# Patient Record
Sex: Male | Born: 1965 | Race: White | Hispanic: No | Marital: Single | Smoking: Never smoker
Health system: Southern US, Community
[De-identification: ages and names within clinical notes are randomized; demographics above are authoritative.]

## PROBLEM LIST (undated history)

## (undated) DIAGNOSIS — J45909 Unspecified asthma, uncomplicated: Secondary | ICD-10-CM

## (undated) DIAGNOSIS — E119 Type 2 diabetes mellitus without complications: Secondary | ICD-10-CM

---

## 2001-07-03 ENCOUNTER — Encounter: Payer: Self-pay | Admitting: Emergency Medicine

## 2001-07-03 ENCOUNTER — Ambulatory Visit (HOSPITAL_BASED_OUTPATIENT_CLINIC_OR_DEPARTMENT_OTHER): Admission: RE | Admit: 2001-07-03 | Discharge: 2001-07-03 | Payer: Self-pay | Admitting: Orthopedic Surgery

## 2003-03-05 ENCOUNTER — Ambulatory Visit: Admission: RE | Admit: 2003-03-05 | Discharge: 2003-03-05 | Payer: Self-pay | Admitting: Family Medicine

## 2003-03-05 ENCOUNTER — Encounter: Payer: Self-pay | Admitting: Family Medicine

## 2003-03-07 ENCOUNTER — Encounter: Payer: Self-pay | Admitting: General Surgery

## 2003-03-07 ENCOUNTER — Encounter: Admission: RE | Admit: 2003-03-07 | Discharge: 2003-03-07 | Payer: Self-pay | Admitting: General Surgery

## 2006-09-15 ENCOUNTER — Encounter: Admission: RE | Admit: 2006-09-15 | Discharge: 2006-09-15 | Payer: Self-pay | Admitting: Orthopaedic Surgery

## 2008-02-27 ENCOUNTER — Emergency Department (HOSPITAL_COMMUNITY): Admission: EM | Admit: 2008-02-27 | Discharge: 2008-02-27 | Payer: Self-pay | Admitting: Emergency Medicine

## 2009-09-01 ENCOUNTER — Observation Stay (HOSPITAL_COMMUNITY): Admission: EM | Admit: 2009-09-01 | Discharge: 2009-09-02 | Payer: Self-pay | Admitting: Emergency Medicine

## 2010-01-31 ENCOUNTER — Emergency Department (HOSPITAL_COMMUNITY): Admission: EM | Admit: 2010-01-31 | Discharge: 2010-01-31 | Payer: Self-pay | Admitting: Family Medicine

## 2010-12-13 LAB — DIFFERENTIAL
Basophils Absolute: 0 10*3/uL (ref 0.0–0.1)
Basophils Relative: 0 % (ref 0–1)
Eosinophils Absolute: 0.1 10*3/uL (ref 0.0–0.7)
Neutro Abs: 4.2 10*3/uL (ref 1.7–7.7)
Neutrophils Relative %: 59 % (ref 43–77)

## 2010-12-13 LAB — CBC
HCT: 40.9 % (ref 39.0–52.0)
Hemoglobin: 13.4 g/dL (ref 13.0–17.0)
MCHC: 33.2 g/dL (ref 30.0–36.0)
MCHC: 33.6 g/dL (ref 30.0–36.0)
MCV: 89.2 fL (ref 78.0–100.0)
MCV: 90.8 fL (ref 78.0–100.0)
Platelets: 215 10*3/uL (ref 150–400)
RBC: 4.46 MIL/uL (ref 4.22–5.81)
RDW: 12.8 % (ref 11.5–15.5)
WBC: 6.7 10*3/uL (ref 4.0–10.5)

## 2010-12-13 LAB — TROPONIN I: Troponin I: 0.01 ng/mL (ref 0.00–0.06)

## 2010-12-13 LAB — RAPID URINE DRUG SCREEN, HOSP PERFORMED
Amphetamines: NOT DETECTED
Barbiturates: NOT DETECTED
Benzodiazepines: NOT DETECTED
Opiates: NOT DETECTED

## 2010-12-13 LAB — CARDIAC PANEL(CRET KIN+CKTOT+MB+TROPI)
CK, MB: 5 ng/mL — ABNORMAL HIGH (ref 0.3–4.0)
Relative Index: 2.1 (ref 0.0–2.5)
Relative Index: 2.2 (ref 0.0–2.5)
Relative Index: 2.4 (ref 0.0–2.5)

## 2010-12-13 LAB — COMPREHENSIVE METABOLIC PANEL
ALT: 31 U/L (ref 0–53)
Alkaline Phosphatase: 42 U/L (ref 39–117)
BUN: 15 mg/dL (ref 6–23)
CO2: 26 mEq/L (ref 19–32)
GFR calc non Af Amer: >60 mL/min (ref >60)
Glucose, Bld: 133 mg/dL — ABNORMAL HIGH (ref 70–99)
Potassium: 3.7 mEq/L (ref 3.5–5.1)
Sodium: 138 mEq/L (ref 135–145)
Total Bilirubin: 0.8 mg/dL (ref 0.3–1.2)

## 2010-12-13 LAB — BASIC METABOLIC PANEL
BUN: 14 mg/dL (ref 6–23)
CO2: 26 mEq/L (ref 19–32)
Chloride: 103 mEq/L (ref 96–112)
Glucose, Bld: 103 mg/dL — ABNORMAL HIGH (ref 70–99)
Potassium: 3.7 mEq/L (ref 3.5–5.1)

## 2010-12-13 LAB — CK TOTAL AND CKMB (NOT AT ARMC): Total CK: 305 U/L — ABNORMAL HIGH (ref 7–232)

## 2010-12-13 LAB — D-DIMER, QUANTITATIVE: D-Dimer, Quant: 0.3 ug/mL-FEU (ref 0.00–0.48)

## 2011-01-28 NOTE — Op Note (Signed)
[facility]  Patient:    Guy Thomas, Guy Thomas Visit Number: [id number] MRN: [id number]          Service Type: DSU Location: [facility] Attending Physician:  Ronne Binning Dictated by:   Nicki Reaper, M.D. Proc. Date: 07/03/01 Admit Date:  07/03/2001                             Operative Report  PREOPERATIVE DIAGNOSIS: Open fracture, left thumb.  POSTOPERATIVE DIAGNOSIS: Open fracture, left thumb.  OPERATION/PROCEDURE: Open reduction and internal fixation of proximal phalanx, intra-articular fracture of left thumb, with repair of extensor tendon.  SURGEON: Nicki Reaper, M.D.  ASSISTANT: Joaquin Courts, R.N.  ANESTHESIA: General.  ANESTHESIOLOGIST: Maren Beach, M.D.  INDICATIONS FOR PROCEDURE: The patient is a 45 year old male, who suffered a crush injury to his left thumb while at work at [employer].  He was referred from the emergency room.  DESCRIPTION OF PROCEDURE: The patient was brought to the operating room, where a general anesthetic was carried out without difficulty.  He was prepped and draped using Betadine scrubbing solution with the left arm free.  The limb was exsanguinated with an Esmarch bandage and a tourniquet placed high on the arm inflated to 250 mm Hg.  The dorsal wound was opened and the laceration to the extensor tendon opened and the interphalangeal joint and fracture of the articular surface identified.  The joint and wound were copiously irrigated with Bacitracin containing saline solution.  The fracture was debrided and reduced, pinned with a 2.8 K-wire transversely.  A second 2.8 K-wire was placed longitudinally through the distal phalanx into the proximal phalanx in an extended position.  The extensor tendon was then repaired with figure-of-eight 4-0 Mersilene sutures.  The volar laceration was explored and found to enter into the subcutaneous tissue only.  The wounds were closed with interrupted 5-0  nylon sutures.  A sterile compressive dressing and splint to the thumb were applied.  The patient tolerated the procedure well and was taken to the recovery room for observation in satisfactory condition.  He is discharged home to return to the [facility] in one week, on Vicodin and Keflex. Dictated by:   Nicki Reaper, M.D. Attending Physician:  Ronne Binning DD:  07/03/01 TD:  07/04/01 Job: [id number]

## 2011-06-09 LAB — CBC
HCT: 41.2
Platelets: 223
WBC: 5.8

## 2011-06-09 LAB — POCT CARDIAC MARKERS
CKMB, poc: 2.6
Operator id: 264031
Troponin i, poc: <0.05

## 2011-06-09 LAB — BASIC METABOLIC PANEL
BUN: 12
Creatinine, Ser: 0.91
GFR calc non Af Amer: >60
Potassium: 4.1

## 2013-05-20 ENCOUNTER — Emergency Department (HOSPITAL_COMMUNITY)
Admission: EM | Admit: 2013-05-20 | Discharge: 2013-05-20 | Disposition: A | Discharge status: Home / Self Care | Payer: Managed Care, Other (non HMO) | Attending: Emergency Medicine | Admitting: Emergency Medicine

## 2013-05-20 ENCOUNTER — Encounter (HOSPITAL_COMMUNITY): Payer: Self-pay | Admitting: Nurse Practitioner

## 2013-05-20 ENCOUNTER — Emergency Department (HOSPITAL_COMMUNITY): Payer: Managed Care, Other (non HMO)

## 2013-05-20 DIAGNOSIS — R350 Frequency of micturition: Secondary | ICD-10-CM | POA: Insufficient documentation

## 2013-05-20 DIAGNOSIS — E785 Hyperlipidemia, unspecified: Secondary | ICD-10-CM | POA: Insufficient documentation

## 2013-05-20 DIAGNOSIS — R1011 Right upper quadrant pain: Secondary | ICD-10-CM | POA: Insufficient documentation

## 2013-05-20 DIAGNOSIS — R109 Unspecified abdominal pain: Secondary | ICD-10-CM

## 2013-05-20 DIAGNOSIS — K5900 Constipation, unspecified: Secondary | ICD-10-CM

## 2013-05-20 DIAGNOSIS — F411 Generalized anxiety disorder: Secondary | ICD-10-CM | POA: Insufficient documentation

## 2013-05-20 DIAGNOSIS — Z880 Allergy status to penicillin: Secondary | ICD-10-CM | POA: Insufficient documentation

## 2013-05-20 DIAGNOSIS — Z79899 Other long term (current) drug therapy: Secondary | ICD-10-CM | POA: Insufficient documentation

## 2013-05-20 DIAGNOSIS — R110 Nausea: Secondary | ICD-10-CM | POA: Insufficient documentation

## 2013-05-20 LAB — COMPREHENSIVE METABOLIC PANEL
AST: 28 U/L (ref 0–37)
CO2: 27 mEq/L (ref 19–32)
Calcium: 9.3 mg/dL (ref 8.4–10.5)
Chloride: 100 mEq/L (ref 96–112)
Creatinine, Ser: 0.78 mg/dL (ref 0.50–1.35)
GFR calc Af Amer: >90 mL/min (ref >90)
GFR calc non Af Amer: >90 mL/min (ref >90)
Glucose, Bld: 104 mg/dL — ABNORMAL HIGH (ref 70–99)
Total Bilirubin: 0.7 mg/dL (ref 0.3–1.2)

## 2013-05-20 LAB — URINALYSIS, ROUTINE W REFLEX MICROSCOPIC
Bilirubin Urine: NEGATIVE
Hgb urine dipstick: NEGATIVE
Nitrite: NEGATIVE
Protein, ur: NEGATIVE mg/dL
Specific Gravity, Urine: 1.016 (ref 1.005–1.030)
Urobilinogen, UA: 0.2 mg/dL (ref 0.0–1.0)

## 2013-05-20 LAB — LIPASE, BLOOD: Lipase: 29 U/L (ref 11–59)

## 2013-05-20 LAB — CBC WITH DIFFERENTIAL/PLATELET
MCHC: 35.6 g/dL (ref 30.0–36.0)
Neutro Abs: 3.3 10*3/uL (ref 1.7–7.7)
Neutrophils Relative %: 50 % (ref 43–77)
RDW: 12.7 % (ref 11.5–15.5)

## 2013-05-20 MED ORDER — MAGNESIUM CITRATE PO SOLN: 1.0000 | Freq: Once | ORAL | Status: DC

## 2013-05-20 MED ORDER — ONDANSETRON HCL 4 MG/2ML IJ SOLN: 4.0000 mg | Freq: Once | INTRAMUSCULAR | Status: DC

## 2013-05-20 MED ORDER — SODIUM CHLORIDE 0.9 % IV BOLUS (SEPSIS): 1000.0000 mL | Freq: Once | INTRAVENOUS | Status: DC

## 2013-05-20 MED ORDER — MAGNESIUM CITRATE PO SOLN
1.0000 | Freq: Once | ORAL | Status: AC
  Administered 2013-05-20: 1 via ORAL
  Filled 2013-05-20: qty 296

## 2013-05-20 MED ORDER — KETOROLAC TROMETHAMINE 30 MG/ML IJ SOLN: 30.0000 mg | Freq: Once | INTRAMUSCULAR | Status: DC

## 2013-05-20 NOTE — ED Provider Notes (Signed)
TIME SEEN: 7:03 PM  CHIEF COMPLAINT: Right-sided abdominal pain, urinary frequency, nausea  HPI: Patient is a 47 yo M all with a history of hyperlipidemia and anxiety who presents to the emergency department with acute onset of right upper quadrant pain that started at 4 AM and woke him from sleep. He states that his pain has migrated down into his right lower quadrant and now into his right testicle. He has had frequent urination but no dysuria or hematuria. He has had nausea but no vomiting. No diarrhea. He had subjective fevers last night. He called his doctor instructed him to come to the emergency department to evaluate for possible kidney stone. He has never had prior history of kidney stones. No prior history of any abdominal surgery. No bad food exposure. No sick contacts. No recent travel.  ROS: See HPI Constitutional: Subjective fever  Eyes: no drainage  ENT: no runny nose   Cardiovascular:  no chest pain  Resp: no SOB  GI: no vomiting GU: no dysuria Integumentary: no rash  Allergy: no hives  Musculoskeletal: no leg swelling  Neurological: no slurred speech ROS otherwise negative  PAST MEDICAL HISTORY/PAST SURGICAL HISTORY:  History reviewed. No pertinent past medical history.  MEDICATIONS:  Prior to Admission medications   Medication Sig Start Date End Date Taking? Authorizing Provider  ALPRAZolam [name]) 0.5 MG tablet Take 0.5 mg by mouth at bedtime as needed for sleep (sleep).   Yes Historical Provider, MD  rosuvastatin (CRESTOR) 10 MG tablet Take 10 mg by mouth daily.   Yes Historical Provider, MD  testosterone (ANDROGEL) 50 MG/5GM GEL Place 5 g onto the skin daily.   Yes Historical Provider, MD    ALLERGIES:  Allergies  Allergen Reactions  . Penicillins     SOCIAL HISTORY:  History  Substance Use Topics  . Smoking status: Never Smoker   . Smokeless tobacco: Not on file  . Alcohol Use: No    FAMILY HISTORY: History reviewed. No pertinent family  history.  EXAM: BP 154/95  Pulse 66  Temp(Src) 98 F (36.7 C) (Oral)  Resp 16  Ht 5\' 11"  (1.803 m)  Wt 182 lb 4.8 oz (82.691 kg)  BMI 25.44 kg/m2  SpO2 98% CONSTITUTIONAL: Alert and oriented and responds appropriately to questions. Well-appearing; well-nourished, no apparent distress HEAD: Normocephalic EYES: Conjunctivae clear, PERRL ENT: normal nose; no rhinorrhea; moist mucous membranes; pharynx without lesions noted NECK: Supple, no meningismus, no LAD  CARD: RRR; S1 and S2 appreciated; no murmurs, no clicks, no rubs, no gallops RESP: Normal chest excursion without splinting or tachypnea; breath sounds clear and equal bilaterally; no wheezes, no rhonchi, no rales,  ABD/GI: Normal bowel sounds; non-distended; soft, non-tender, no rebound, no guarding GU:  No external genital lesions, no penile discharge, no scrotal masses, testicles are nontender to palpation, cremasteric reflex normal bilaterally, 2+ femoral pulses bilaterally BACK:  The back appears normal and is non-tender to palpation, there is no CVA tenderness EXT: Normal ROM in all joints; non-tender to palpation; no edema; normal capillary refill; no cyanosis    SKIN: Normal color for age and race; warm NEURO: Moves all extremities equally PSYCH: The patient's mood and manner are appropriate. Grooming and personal hygiene are appropriate.  MEDICAL DECISION MAKING: Patient with migrating right-sided abdominal pain consistent with possible kidney stone. He is hemodynamically stable. Labs unremarkable. Urinalysis pending. Will also obtain a CT of his abdomen and pelvis. We'll give pain in nausea medication. Anticipate discharge home with outpatient followup.  ED  PROGRESS: Urine shows no sign of infection or blood. CT scan pending. Signed out to night team.    Layla Maw Flint Hakeem, DO 05/20/13 2031

## 2013-05-20 NOTE — ED Notes (Signed)
Pt c/o RUQ, R testicle pain, frequent painful urination, and nausea throughout the day today.

## 2014-08-13 ENCOUNTER — Ambulatory Visit (INDEPENDENT_AMBULATORY_CARE_PROVIDER_SITE_OTHER): Payer: Managed Care, Other (non HMO) | Admitting: Podiatry

## 2014-08-13 ENCOUNTER — Other Ambulatory Visit: Payer: Self-pay | Admitting: Podiatry

## 2014-08-13 ENCOUNTER — Encounter: Payer: Self-pay | Admitting: Podiatry

## 2014-08-13 ENCOUNTER — Ambulatory Visit (INDEPENDENT_AMBULATORY_CARE_PROVIDER_SITE_OTHER): Payer: Managed Care, Other (non HMO)

## 2014-08-13 VITALS — BP 112/71 | HR 61 | Resp 16

## 2014-08-13 DIAGNOSIS — M7662 Achilles tendinitis, left leg: Secondary | ICD-10-CM

## 2014-08-13 DIAGNOSIS — M779 Enthesopathy, unspecified: Secondary | ICD-10-CM

## 2014-08-13 MED ORDER — TRIAMCINOLONE ACETONIDE 10 MG/ML IJ SUSP
10.0000 mg | Freq: Once | INTRAMUSCULAR | Status: AC
  Administered 2014-08-13: 10 mg

## 2014-08-13 MED ORDER — DICLOFENAC SODIUM 75 MG PO TBEC: 75.0000 mg | DELAYED_RELEASE_TABLET | Freq: Two times a day (BID) | ORAL | Status: DC

## 2014-08-13 NOTE — Progress Notes (Signed)
   Subjective:    Patient ID: Guy Thomas, male    DOB: 04/26/1966, 48 y.o.   MRN: [id number]  HPI Comments: "I hurt my foot but didn't realize it"  Patient c/o aching lateral and dorsal foot for about 2-3 weeks. He does jujitsu and noticed when he took shoes off lateral foot was swollen and bruised. That is now better but more pain in achilles area. He has been trying to wear boots which helps.  Foot Pain      Review of Systems  Respiratory:       Calf pain with walking  All other systems reviewed and are negative.      Objective:   Physical Exam        Assessment & Plan:

## 2014-08-13 NOTE — Progress Notes (Signed)
Subjective:     Patient ID: Guy Thomas, male   DOB: 08/28/1966, 48 y.o.   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      Objective:   Physical Exam  Constitutional: He is oriented to person, place, and time.  Cardiovascular: Intact distal pulses.   Musculoskeletal: Normal range of motion.  Neurological: He is oriented to person, place, and time.  Skin: Skin is warm.  Nursing note and vitals reviewed.  neurovascular status intact with muscle strength adequate range of motion subtalar and midtarsal joint within normal limits. Patient has discomfort lateral side of the foot that is near the peroneal insertion but not directly on bone and also mild discomfort in the Achilles which is more at the musculotendinous junction with the tendon found to be working well     Assessment:     Lateral foot tendinitis with probable compensatory mild Achilles tendinitis due to gait change    Plan:     H&P and x-rays reviewed. Injected the lateral foot 3 mg Kenalog 5 mg Xylocaine and instructed on physical therapy and stretching for the Achilles. Reappoint if symptoms indicate

## 2015-03-19 ENCOUNTER — Observation Stay (HOSPITAL_BASED_OUTPATIENT_CLINIC_OR_DEPARTMENT_OTHER): Payer: No Typology Code available for payment source

## 2015-03-19 ENCOUNTER — Encounter (HOSPITAL_COMMUNITY): Payer: Self-pay

## 2015-03-19 ENCOUNTER — Other Ambulatory Visit (HOSPITAL_COMMUNITY): Payer: Managed Care, Other (non HMO)

## 2015-03-19 ENCOUNTER — Observation Stay (HOSPITAL_COMMUNITY)
Admission: EM | Admit: 2015-03-19 | Discharge: 2015-03-20 | Disposition: A | Discharge status: Home / Self Care | Payer: No Typology Code available for payment source | Attending: Internal Medicine | Admitting: Internal Medicine

## 2015-03-19 ENCOUNTER — Inpatient Hospital Stay (HOSPITAL_COMMUNITY): Payer: No Typology Code available for payment source

## 2015-03-19 ENCOUNTER — Observation Stay (HOSPITAL_COMMUNITY): Payer: Managed Care, Other (non HMO)

## 2015-03-19 ENCOUNTER — Observation Stay (HOSPITAL_COMMUNITY): Payer: No Typology Code available for payment source

## 2015-03-19 ENCOUNTER — Emergency Department (HOSPITAL_COMMUNITY): Payer: No Typology Code available for payment source

## 2015-03-19 DIAGNOSIS — Z880 Allergy status to penicillin: Secondary | ICD-10-CM | POA: Insufficient documentation

## 2015-03-19 DIAGNOSIS — M25511 Pain in right shoulder: Secondary | ICD-10-CM | POA: Diagnosis not present

## 2015-03-19 DIAGNOSIS — R7989 Other specified abnormal findings of blood chemistry: Secondary | ICD-10-CM | POA: Diagnosis present

## 2015-03-19 DIAGNOSIS — M47812 Spondylosis without myelopathy or radiculopathy, cervical region: Secondary | ICD-10-CM | POA: Diagnosis present

## 2015-03-19 DIAGNOSIS — R2990 Unspecified symptoms and signs involving the nervous system: Secondary | ICD-10-CM

## 2015-03-19 DIAGNOSIS — I639 Cerebral infarction, unspecified: Secondary | ICD-10-CM

## 2015-03-19 DIAGNOSIS — R569 Unspecified convulsions: Secondary | ICD-10-CM | POA: Diagnosis present

## 2015-03-19 DIAGNOSIS — M47892 Other spondylosis, cervical region: Secondary | ICD-10-CM | POA: Insufficient documentation

## 2015-03-19 DIAGNOSIS — F419 Anxiety disorder, unspecified: Secondary | ICD-10-CM | POA: Diagnosis not present

## 2015-03-19 DIAGNOSIS — G459 Transient cerebral ischemic attack, unspecified: Secondary | ICD-10-CM

## 2015-03-19 DIAGNOSIS — E785 Hyperlipidemia, unspecified: Secondary | ICD-10-CM | POA: Diagnosis present

## 2015-03-19 DIAGNOSIS — J45909 Unspecified asthma, uncomplicated: Secondary | ICD-10-CM | POA: Insufficient documentation

## 2015-03-19 DIAGNOSIS — R200 Anesthesia of skin: Secondary | ICD-10-CM

## 2015-03-19 DIAGNOSIS — I4510 Unspecified right bundle-branch block: Secondary | ICD-10-CM | POA: Insufficient documentation

## 2015-03-19 DIAGNOSIS — Z7982 Long term (current) use of aspirin: Secondary | ICD-10-CM | POA: Insufficient documentation

## 2015-03-19 DIAGNOSIS — R202 Paresthesia of skin: Secondary | ICD-10-CM

## 2015-03-19 HISTORY — DX: Unspecified asthma, uncomplicated: J45.909

## 2015-03-19 LAB — DIFFERENTIAL
Basophils Absolute: 0 10*3/uL (ref 0.0–0.1)
Basophils Relative: 0 % (ref 0–1)
Eosinophils Absolute: 0.1 10*3/uL (ref 0.0–0.7)
Eosinophils Relative: 2 % (ref 0–5)
Lymphocytes Relative: 42 % (ref 12–46)
Lymphs Abs: 2.6 10*3/uL (ref 0.7–4.0)
Monocytes Absolute: 0.6 10*3/uL (ref 0.1–1.0)
Monocytes Relative: 10 % (ref 3–12)
Neutro Abs: 2.8 10*3/uL (ref 1.7–7.7)
Neutrophils Relative %: 46 % (ref 43–77)

## 2015-03-19 LAB — RAPID URINE DRUG SCREEN, HOSP PERFORMED
Amphetamines: NOT DETECTED
Barbiturates: NOT DETECTED
Benzodiazepines: NOT DETECTED
Cocaine: NOT DETECTED
Opiates: NOT DETECTED
Tetrahydrocannabinol: NOT DETECTED

## 2015-03-19 LAB — URINALYSIS, ROUTINE W REFLEX MICROSCOPIC
Bilirubin Urine: NEGATIVE
Glucose, UA: NEGATIVE mg/dL
Hgb urine dipstick: NEGATIVE
Ketones, ur: NEGATIVE mg/dL
Leukocytes, UA: NEGATIVE
Nitrite: NEGATIVE
Protein, ur: NEGATIVE mg/dL
Specific Gravity, Urine: 1.016 (ref 1.005–1.030)
Urobilinogen, UA: 0.2 mg/dL (ref 0.0–1.0)
pH: 5.5 (ref 5.0–8.0)

## 2015-03-19 LAB — I-STAT CHEM 8, ED
BUN: 19 mg/dL (ref 6–20)
Calcium, Ion: 1.14 mmol/L (ref 1.12–1.23)
Chloride: 100 mmol/L — ABNORMAL LOW (ref 101–111)
Creatinine, Ser: 0.8 mg/dL (ref 0.61–1.24)
Glucose, Bld: 97 mg/dL (ref 65–99)
HCT: 48 % (ref 39.0–52.0)
Hemoglobin: 16.3 g/dL (ref 13.0–17.0)
Potassium: 3.7 mmol/L (ref 3.5–5.1)
Sodium: 138 mmol/L (ref 135–145)
TCO2: 24 mmol/L (ref 0–100)

## 2015-03-19 LAB — CBG MONITORING, ED: Glucose-Capillary: 90 mg/dL (ref 65–99)

## 2015-03-19 LAB — COMPREHENSIVE METABOLIC PANEL
ALT: 31 U/L (ref 17–63)
AST: 43 U/L — ABNORMAL HIGH (ref 15–41)
Albumin: 3.9 g/dL (ref 3.5–5.0)
Alkaline Phosphatase: 45 U/L (ref 38–126)
Anion gap: 9 (ref 5–15)
BUN: 16 mg/dL (ref 6–20)
CO2: 26 mmol/L (ref 22–32)
Calcium: 8.7 mg/dL — ABNORMAL LOW (ref 8.9–10.3)
Chloride: 101 mmol/L (ref 101–111)
Creatinine, Ser: 0.87 mg/dL (ref 0.61–1.24)
GFR calc Af Amer: >60 mL/min (ref >60)
GFR calc non Af Amer: >60 mL/min (ref >60)
Glucose, Bld: 96 mg/dL (ref 65–99)
Potassium: 3.8 mmol/L (ref 3.5–5.1)
Sodium: 136 mmol/L (ref 135–145)
Total Bilirubin: 1 mg/dL (ref 0.3–1.2)
Total Protein: 6.6 g/dL (ref 6.5–8.1)

## 2015-03-19 LAB — CBC
HCT: 45.3 % (ref 39.0–52.0)
Hemoglobin: 15.5 g/dL (ref 13.0–17.0)
MCH: 31.1 pg (ref 26.0–34.0)
MCHC: 34.2 g/dL (ref 30.0–36.0)
MCV: 90.8 fL (ref 78.0–100.0)
Platelets: 182 10*3/uL (ref 150–400)
RBC: 4.99 MIL/uL (ref 4.22–5.81)
RDW: 12.8 % (ref 11.5–15.5)
WBC: 6.2 10*3/uL (ref 4.0–10.5)

## 2015-03-19 LAB — APTT: aPTT: 25 seconds (ref 24–37)

## 2015-03-19 LAB — ETHANOL: Alcohol, Ethyl (B): <5 mg/dL (ref <5)

## 2015-03-19 LAB — PROTIME-INR
INR: 1.01 (ref 0.00–1.49)
Prothrombin Time: 13.5 seconds (ref 11.6–15.2)

## 2015-03-19 LAB — I-STAT TROPONIN, ED: Troponin i, poc: 0.01 ng/mL (ref 0.00–0.08)

## 2015-03-19 MED ORDER — DICLOFENAC SODIUM 75 MG PO TBEC
75.0000 mg | DELAYED_RELEASE_TABLET | Freq: Two times a day (BID) | ORAL | Status: DC
  Administered 2015-03-19 – 2015-03-20 (×2): 75 mg via ORAL
  Filled 2015-03-19 (×3): qty 1

## 2015-03-19 MED ORDER — ALPRAZOLAM 0.5 MG PO TABS: 0.5000 mg | ORAL_TABLET | Freq: Every evening | ORAL | Status: DC | PRN

## 2015-03-19 MED ORDER — ASPIRIN 300 MG RE SUPP: 300.0000 mg | Freq: Every day | RECTAL | Status: DC

## 2015-03-19 MED ORDER — ENOXAPARIN SODIUM 40 MG/0.4ML ~~LOC~~ SOLN
40.0000 mg | SUBCUTANEOUS | Status: DC
  Administered 2015-03-19 – 2015-03-20 (×2): 40 mg via SUBCUTANEOUS
  Filled 2015-03-19 (×2): qty 0.4

## 2015-03-19 MED ORDER — SENNOSIDES-DOCUSATE SODIUM 8.6-50 MG PO TABS: 1.0000 | ORAL_TABLET | Freq: Every evening | ORAL | Status: DC | PRN

## 2015-03-19 MED ORDER — STROKE: EARLY STAGES OF RECOVERY BOOK
Freq: Once | Status: AC
  Administered 2015-03-19: 13:00:00

## 2015-03-19 MED ORDER — LORAZEPAM 2 MG/ML IJ SOLN
0.5000 mg | INTRAMUSCULAR | Status: DC | PRN
Start: 2015-03-19 — End: 2015-03-20
  Administered 2015-03-19 – 2015-03-20 (×2): 0.5 mg via INTRAVENOUS
  Filled 2015-03-19 (×3): qty 1

## 2015-03-19 MED ORDER — ASPIRIN 325 MG PO TABS
325.0000 mg | ORAL_TABLET | Freq: Every day | ORAL | Status: DC
  Administered 2015-03-19 – 2015-03-20 (×2): 325 mg via ORAL
  Filled 2015-03-19 (×2): qty 1

## 2015-03-19 MED ORDER — ALPRAZOLAM 0.25 MG PO TABS
0.2500 mg | ORAL_TABLET | Freq: Once | ORAL | Status: DC
  Filled 2015-03-19: qty 1

## 2015-03-19 MED ORDER — IOHEXOL 350 MG/ML SOLN
80.0000 mL | Freq: Once | INTRAVENOUS | Status: AC | PRN
  Administered 2015-03-19: 80 mL via INTRAVENOUS

## 2015-03-19 MED ORDER — TESTOSTERONE 50 MG/5GM (1%) TD GEL: 5.0000 g | Freq: Every day | TRANSDERMAL | Status: DC

## 2015-03-19 MED ORDER — ROSUVASTATIN CALCIUM 10 MG PO TABS
10.0000 mg | ORAL_TABLET | Freq: Every day | ORAL | Status: DC
  Administered 2015-03-19: 10 mg via ORAL
  Filled 2015-03-19 (×2): qty 1

## 2015-03-19 NOTE — ED Notes (Signed)
Dr Juleen ChinaKohut at bedside.  Code stroke called.
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
## 2015-03-19 NOTE — Progress Notes (Signed)
EEG completed, results pending. 

## 2015-03-19 NOTE — ED Notes (Signed)
Pt unable to complete MRI pt returned to room at this time.

## 2015-03-19 NOTE — Progress Notes (Signed)
  Echocardiogram 2D Echocardiogram has been performed.  Delcie RochENNINGTON, Lorelle Macaluso 03/19/2015, 5:37 PM
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
## 2015-03-19 NOTE — ED Notes (Signed)
Pt states that he has had to be medicated with prior MRI. Pt states that he is willing to try MRI this morning without medication. Requested prn orders for patient.

## 2015-03-19 NOTE — ED Notes (Signed)
MD at bedside. 

## 2015-03-19 NOTE — Progress Notes (Signed)
Patient arrived to room from ED. Safety precautions and orders reviewed with patient. MD paged per pt's arrival. TELE applied and confirmed. Will continue to monitor.   Sim BoastHavy, RN

## 2015-03-19 NOTE — ED Notes (Addendum)
PT states he was driving to work and he began experiencing dizziness, sob and L sided numbness.  He felt he was going to pass out.  Denies weakness.  Pt sates took 325 mg asa this am per norm.

## 2015-03-19 NOTE — ED Provider Notes (Signed)
CSN: [id number]     Arrival date & time 03/19/15  1308 History   First MD Initiated Contact with Patient 03/19/15 0700     Chief Complaint  Patient presents with  . Numbness  . Near Syncope     (Consider location/radiation/quality/duration/timing/severity/associated sxs/prior Treatment) HPI   48yM with L sided numbness. Onset around 0640 this morning while driving to work. Sudden onset dizziness, diaphoresis and numbness of L arm and leg. Describes "dizzy" as sensation that he was about to pass out.  Currently feels better, but numbness has persisted. Took aspirin shortly after symptoms began. Denies any pain anywhere. No focal weakness. No visual complaints. Hx of high cholesterol and takes crestor. Denies any cardiac history. Runs 3x per week and lifts weights regularly and has been asymptomatic when doing this recently.    No past medical history on file. No past surgical history on file. No family history on file. History  Substance Use Topics  . Smoking status: Never Smoker   . Smokeless tobacco: Not on file  . Alcohol Use: No    Review of Systems  All systems reviewed and negative, other than as noted in HPI.   Allergies  Penicillins  Home Medications   Prior to Admission medications   Medication Sig Start Date End Date Taking? Authorizing Provider  ALPRAZolam [name]) 0.5 MG tablet Take 0.5 mg by mouth at bedtime as needed for sleep (sleep).    Historical Provider, MD  diclofenac (VOLTAREN) 75 MG EC tablet Take 1 tablet (75 mg total) by mouth 2 (two) times daily. 08/13/14   [name], DPM  rosuvastatin (CRESTOR) 10 MG tablet Take 10 mg by mouth daily.    Historical Provider, MD  testosterone (ANDROGEL) 50 MG/5GM GEL Place 5 g onto the skin daily.    Historical Provider, MD   BP 142/86 mmHg  Pulse 68  Temp(Src) 97.5 F (36.4 C) (Oral)  Resp 15  SpO2 99% Physical Exam  Constitutional: He is oriented to person, place, and time. He appears well-developed and  well-nourished. No distress.  HENT:  Head: Normocephalic and atraumatic.  Eyes: Conjunctivae are normal. Right eye exhibits no discharge. Left eye exhibits no discharge.  Neck: Neck supple.  Cardiovascular: Normal rate, regular rhythm and normal heart sounds.  Exam reveals no gallop and no friction rub.   No murmur heard. Pulmonary/Chest: Effort normal and breath sounds normal. No respiratory distress.  Abdominal: Soft. He exhibits no distension. There is no tenderness.  Musculoskeletal: He exhibits no edema or tenderness.  Neurological: He is alert and oriented to person, place, and time.  Speech clear. Content appropriate. CN 2-12 intact. Strength 5/5 R side. Pronator drift LUE. Strength 5/5 LLE. Decreased sensation to light touch on L side.   Skin: Skin is warm and dry.  Psychiatric: He has a normal mood and affect. His behavior is normal. Thought content normal.  Nursing note and vitals reviewed.   ED Course  Procedures (including critical care time)  CRITICAL CARE Performed by: Raeford Razor Total critical care time: 35 minutes Re: stroke symptoms, consideration for TPA Critical care time was exclusive of separately billable procedures and treating other patients. Critical care was necessary to treat or prevent imminent or life-threatening deterioration. Critical care was time spent personally by me on the following activities: development of treatment plan with patient and/or surrogate as well as nursing, discussions with consultants, evaluation of patient's response to treatment, examination of patient, obtaining history from patient or surrogate, ordering and performing  treatments and interventions, ordering and review of laboratory studies, ordering and review of radiographic studies, pulse oximetry and re-evaluation of patient's condition.   Labs Review Labs Reviewed  COMPREHENSIVE METABOLIC PANEL - Abnormal; Notable for the following:    Calcium 8.7 (*)    AST 43 (*)     All other components within normal limits  I-STAT CHEM 8, ED - Abnormal; Notable for the following:    Chloride 100 (*)    All other components within normal limits  ETHANOL  PROTIME-INR  APTT  CBC  DIFFERENTIAL  URINE RAPID DRUG SCREEN, HOSP PERFORMED  URINALYSIS, ROUTINE W REFLEX MICROSCOPIC (NOT AT [facility])  I-STAT TROPOININ, ED  CBG MONITORING, ED    Imaging Review Ct Angio Head W/cm &/or Wo Cm  03/19/2015   CLINICAL DATA:  Left-sided weakness and numbness.  EXAM: CT ANGIOGRAPHY HEAD AND NECK  TECHNIQUE: Multidetector CT imaging of the head and neck was performed using the standard protocol during bolus administration of intravenous contrast. Multiplanar CT image reconstructions and MIPs were obtained to evaluate the vascular anatomy. Carotid stenosis measurements (when applicable) are obtained utilizing NASCET criteria, using the distal internal carotid diameter as the denominator.  CONTRAST:  80mL OMNIPAQUE IOHEXOL 350 MG/ML SOLN  COMPARISON:  CT head without contrast 03/19/2015.  FINDINGS: CT HEAD  Brain: The source images demonstrate no acute cortical infarct, hemorrhage, or mass lesion. Ventricles are of normal size. No significant extra-axial fluid collection is present. The basal ganglia and insular cortex is intact.  ASPECTS score = 10/10  [place] Stroke Program Early CT Score  Normal score = 10  Calvarium and skull base: Within normal limits  Paranasal sinuses: Clear  Orbits: Within normal limits  CTA NECK  Aortic arch: A 3 vessel arch configuration is present. There is no significant atherosclerotic calcification or stenosis at the arch.  Right carotid system: To the right common carotid artery is within normal limits. The bifurcation is unremarkable. The cervical right ICA is normal. No vascular injury is evident.  Left carotid system: The left common carotid artery is within normal limits. Focal calcification is noted along the posterior aspect of the bifurcation without a significant  stenosis. Cervical ICA is otherwise normal.  Vertebral arteries:The vertebral arteries both originate from the subclavian arteries. There is minimal calcification at the origin of the right vertebral artery without a significant stenosis relative to the more distal vessel. The left vertebral artery is the dominant vessel. No significant focal stenoses or vascular injury is evident within the vertebral arteries of the neck.  Skeleton: Bone windows demonstrate endplate degenerative change in uncovertebral disease most prominently at C3-4 and C4-5. Facet disease is more prominent left than right at C2-3 and C3-4 with fusion across the facet joints at C2-3 bilaterally. There is reversal of the cervical lordosis superiorly.  Other neck: Soft tissues of the neck are otherwise unremarkable. The thyroid is within normal limits. The lung apices are clear. No significant adenopathy is present. Salivary glands are within normal limits.  CTA HEAD  Anterior circulation: Minimal atherosclerotic calcifications are evident within the left cavernous internal carotid artery. There is no significant stenosis or vascular injury. The terminal ICA is normal bilaterally. The A1 and M1 segments are normal. The anterior communicating artery is patent. The MCA bifurcations are intact. ACA and MCA branch vessels are within normal limits.  Posterior circulation: The left vertebral artery is the dominant vessel. PICA origins are visualized and normal. The basilar artery is within normal limits. Both  posterior cerebral arteries originate from the basilar tip. The PCA branch vessels are within normal limits.  Venous sinuses: The dural sinuses are patent. The straight sinus and deep cerebral veins are within normal limits. The cortical veins are intact.  Anatomic variants: None  Delayed phase: No pathologic enhancement is evident.  IMPRESSION: 1. Minimal atherosclerotic changes at the left cavernous internal carotid artery and left carotid  bifurcation without significant stenosis. 2. No large vessel occlusion. 3. Normal variant circle of Willis without significant proximal stenosis, aneurysm, or branch vessel occlusion.   Electronically Signed   By: Marin Robertshristopher  Mattern M.D.   On: 03/19/2015 08:27   Ct Head Wo Contrast  03/19/2015   CLINICAL DATA:  Code stroke.  Left-sided weakness and numbness  EXAM: CT HEAD WITHOUT CONTRAST  TECHNIQUE: Contiguous axial images were obtained from the base of the skull through the vertex without intravenous contrast.  COMPARISON:  None.  FINDINGS: Ventricle size normal. Mild chronic microvascular ischemic change in the white matter.  Negative for acute infarct.  Negative for hemorrhage or mass.  Calvarium intact.  IMPRESSION: Mild chronic microvascular ischemic change in the white matter. No acute abnormality.  Critical Value/emergent results were called by telephone at the time of interpretation on 03/19/2015 at 7:34 am to Dr. Raeford RazorSTEPHEN Orestes Geiman , who verbally acknowledged these results.   Electronically Signed   By: Marlan Palauharles  Clark M.D.   On: 03/19/2015 07:34   Ct Angio Neck W/cm &/or Wo/cm  03/19/2015   CLINICAL DATA:  Left-sided weakness and numbness.  EXAM: CT ANGIOGRAPHY HEAD AND NECK  TECHNIQUE: Multidetector CT imaging of the head and neck was performed using the standard protocol during bolus administration of intravenous contrast. Multiplanar CT image reconstructions and MIPs were obtained to evaluate the vascular anatomy. Carotid stenosis measurements (when applicable) are obtained utilizing NASCET criteria, using the distal internal carotid diameter as the denominator.  CONTRAST:  80mL OMNIPAQUE IOHEXOL 350 MG/ML SOLN  COMPARISON:  CT head without contrast 03/19/2015.  FINDINGS: CT HEAD  Brain: The source images demonstrate no acute cortical infarct, hemorrhage, or mass lesion. Ventricles are of normal size. No significant extra-axial fluid collection is present. The basal ganglia and insular cortex is intact.   ASPECTS score = 10/10  [place] Stroke Program Early CT Score  Normal score = 10  Calvarium and skull base: Within normal limits  Paranasal sinuses: Clear  Orbits: Within normal limits  CTA NECK  Aortic arch: A 3 vessel arch configuration is present. There is no significant atherosclerotic calcification or stenosis at the arch.  Right carotid system: To the right common carotid artery is within normal limits. The bifurcation is unremarkable. The cervical right ICA is normal. No vascular injury is evident.  Left carotid system: The left common carotid artery is within normal limits. Focal calcification is noted along the posterior aspect of the bifurcation without a significant stenosis. Cervical ICA is otherwise normal.  Vertebral arteries:The vertebral arteries both originate from the subclavian arteries. There is minimal calcification at the origin of the right vertebral artery without a significant stenosis relative to the more distal vessel. The left vertebral artery is the dominant vessel. No significant focal stenoses or vascular injury is evident within the vertebral arteries of the neck.  Skeleton: Bone windows demonstrate endplate degenerative change in uncovertebral disease most prominently at C3-4 and C4-5. Facet disease is more prominent left than right at C2-3 and C3-4 with fusion across the facet joints at C2-3 bilaterally. There is reversal of the  cervical lordosis superiorly.  Other neck: Soft tissues of the neck are otherwise unremarkable. The thyroid is within normal limits. The lung apices are clear. No significant adenopathy is present. Salivary glands are within normal limits.  CTA HEAD  Anterior circulation: Minimal atherosclerotic calcifications are evident within the left cavernous internal carotid artery. There is no significant stenosis or vascular injury. The terminal ICA is normal bilaterally. The A1 and M1 segments are normal. The anterior communicating artery is patent. The MCA  bifurcations are intact. ACA and MCA branch vessels are within normal limits.  Posterior circulation: The left vertebral artery is the dominant vessel. PICA origins are visualized and normal. The basilar artery is within normal limits. Both posterior cerebral arteries originate from the basilar tip. The PCA branch vessels are within normal limits.  Venous sinuses: The dural sinuses are patent. The straight sinus and deep cerebral veins are within normal limits. The cortical veins are intact.  Anatomic variants: None  Delayed phase: No pathologic enhancement is evident.  IMPRESSION: 1. Minimal atherosclerotic changes at the left cavernous internal carotid artery and left carotid bifurcation without significant stenosis. 2. No large vessel occlusion. 3. Normal variant circle of Willis without significant proximal stenosis, aneurysm, or branch vessel occlusion.   Electronically Signed   By: Marin Roberts M.D.   On: 03/19/2015 08:27     EKG Interpretation   Date/Time:  Thursday March 19 2015 07:05:58 EDT Ventricular Rate:  59 PR Interval:  155 QRS Duration: 113 QT Interval:  423 QTC Calculation: 419 R Axis:   79 Text Interpretation:  Sinus rhythm Incomplete right bundle branch block No  old tracing to compare Confirmed by Jerod Mcquain  MD, Lovel Suazo ([id number]) on 03/19/2015  7:11:54 AM      MDM   Final diagnoses:  Acute CVA (cerebrovascular accident)    48yM with L sided numbness. I also think he has a pronator drift on L. Consider ACS, but doubt. Atypical symptoms. EKG non concerning. Concerning for CVA and made "Code Stroke."     Raeford Razor, MD 03/19/15 [phone]
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
## 2015-03-19 NOTE — ED Notes (Signed)
Stroke team at bedside

## 2015-03-19 NOTE — ED Notes (Signed)
Patient transported to CT for CTA 

## 2015-03-19 NOTE — ED Notes (Signed)
Patient transported to MRI 

## 2015-03-19 NOTE — Procedures (Signed)
EEG report.  Brief clinical history: 49 y.o. male with a history of hyperlipidemia presenting with left hemiparesis and hemisensory changes as well as speech difficulty. Deficits have markedly improved. He still has mild sensory changes and minimal coordination abnormality involving left lower extremity.    Technique: this is a 17 channel routine scalp EEG performed at the bedside with bipolar and monopolar montages arranged in accordance to the international 10/20 system of electrode placement. One channel was dedicated to EKG recording.  The study was performed during wakefulness, drowsiness, and stage 2 sleep. Hyperventilation and intermittent photic stimulation were utilized as activating procedures.  Description:In the wakeful state, the best background consisted of a medium amplitude, posterior dominant, well sustained, symmetric and reactive 10 Hz rhythm. Drowsiness demonstrated dropout of the alpha rhythm. Stage 2 sleep showed symmetric and synchronous sleep spindles without intermixed epileptiform discharges. Hyperventilation induced mild, diffuse, physiologic slowing but no epileptiform discharges. Intermittent photic stimulation did induce a normal driving response.  No focal or generalized epileptiform discharges noted.  No pathologic areas of slowing seen.  EKG showed sinus rhythm.  Impression: this is a normal awake and asleep EEG. Please, be aware that a normal EEG does not exclude the possibility of epilepsy.  Clinical correlation is advised.   Wyatt Portelasvaldo Khai Arrona, MD [facility]

## 2015-03-19 NOTE — ED Notes (Signed)
MD at bedside.- Neurol

## 2015-03-20 ENCOUNTER — Observation Stay (HOSPITAL_COMMUNITY): Payer: No Typology Code available for payment source

## 2015-03-20 DIAGNOSIS — E291 Testicular hypofunction: Secondary | ICD-10-CM

## 2015-03-20 DIAGNOSIS — M25511 Pain in right shoulder: Secondary | ICD-10-CM

## 2015-03-20 DIAGNOSIS — F411 Generalized anxiety disorder: Secondary | ICD-10-CM | POA: Diagnosis not present

## 2015-03-20 DIAGNOSIS — R569 Unspecified convulsions: Secondary | ICD-10-CM

## 2015-03-20 DIAGNOSIS — M47812 Spondylosis without myelopathy or radiculopathy, cervical region: Secondary | ICD-10-CM | POA: Diagnosis not present

## 2015-03-20 DIAGNOSIS — E785 Hyperlipidemia, unspecified: Secondary | ICD-10-CM

## 2015-03-20 DIAGNOSIS — G459 Transient cerebral ischemic attack, unspecified: Principal | ICD-10-CM

## 2015-03-20 LAB — LIPID PANEL
CHOL/HDL RATIO: 3.1 ratio
CHOLESTEROL: 151 mg/dL (ref 0–200)
HDL: 48 mg/dL (ref >40)
LDL Cholesterol: 85 mg/dL (ref 0–99)
Triglycerides: 90 mg/dL (ref <150)
VLDL: 18 mg/dL (ref 0–40)

## 2015-03-20 NOTE — Progress Notes (Signed)
Discharge order received. Pt alert and oriented. Discharge instructions provided with verbalize understanding. D/C to home with son by bedside. IV lines removed. Ambulatory to vehicle.  Shella SpearingKahin, Charity fundraiserN.
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
## 2015-03-20 NOTE — Progress Notes (Signed)
SLP Cancellation Note  Patient Details Name: Guy Thomas MRN: [id number] DOB: 11/26/1965   Cancelled treatment:       Reason Eval/Treat Not Completed: SLP screened, no needs identified, will sign off. Pt and his son believe that he is at his baseline and MRI is negative for acute infarct.    Maxcine HamLaura Paiewonsky, M.A. CCC-SLP [phone]  Maxcine Hamaiewonsky, Samentha Perham 03/20/2015, 2:15 PM
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
## 2015-03-20 NOTE — Progress Notes (Signed)
SLP Cancellation Note  Patient Details Name: Guy Thomas MRN: [id number] DOB: 11/30/1965   Cancelled treatment:       Reason Eval/Treat Not Completed: Patient at procedure or test/unavailable   Guy Thomas, Guy Thomas [phone]  Guy Hamaiewonsky, Nahiara Kretzschmar 03/20/2015, 12:05 PM
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
## 2015-03-20 NOTE — Progress Notes (Signed)
PT Cancellation Note  Patient Details Name: Guy Thomas MRN: [id number] DOB: 10/05/1965   Cancelled Treatment:    Reason Eval/Treat Not Completed: PT screened, no needs identified, will sign off (Per pt and OT he is at baseline, pt reports no weakness, numbness or difficulty with coordination. PT signing off. )   Tamala SerUhlenberg, Ziv Welchel Kistler 03/20/2015, 9:24 AM [phone]

## 2015-03-21 LAB — HEMOGLOBIN A1C
Hgb A1c MFr Bld: 5.8 % — ABNORMAL HIGH (ref 4.8–5.6)
Mean Plasma Glucose: 120 mg/dL

## 2016-06-16 IMAGING — CT CT ANGIO NECK
1 of 10 series · 1 of 33 positions shown · IV contrast (Iohexol (Omnipaque 350))
Comparison: CT head without contrast 03/19/2015.

CLINICAL DATA: Left-sided weakness and numbness.



[Series 200: locator · axial · 0.49mm/px · 1 of 1 slices shown]
[im 1/1  soft-tissue]
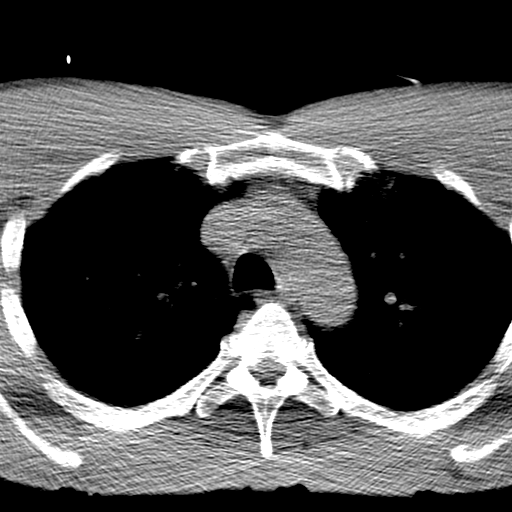

[1 of 33 positions shown; findings below may reference images not displayed]

FINDINGS: CT HEAD

Brain: The source images demonstrate no acute cortical infarct,
hemorrhage, or mass lesion. Ventricles are of normal size. No
significant extra-axial fluid collection is present. The basal
ganglia and insular cortex is intact.

ASPECTS score = [DATE]

[place] Stroke Program Early CT Score

Normal score = 10

Calvarium and skull base: Within normal limits

Paranasal sinuses: Clear

Orbits: Within normal limits

CTA NECK

Aortic arch: A 3 vessel arch configuration is present. There is no
significant atherosclerotic calcification or stenosis at the arch.

Right carotid system: To the right common carotid artery is within
normal limits. The bifurcation is unremarkable. The cervical right
ICA is normal. No vascular injury is evident.

Left carotid system: The left common carotid artery is within normal
limits. Focal calcification is noted along the posterior aspect of
the bifurcation without a significant stenosis. Cervical ICA is
otherwise normal.

Vertebral arteries:The vertebral arteries both originate from the
subclavian arteries. There is minimal calcification at the origin of
the right vertebral artery without a significant stenosis relative
to the more distal vessel. The left vertebral artery is the dominant
vessel. No significant focal stenoses or vascular injury is evident
within the vertebral arteries of the neck.

Skeleton: Bone windows demonstrate endplate degenerative change in
uncovertebral disease most prominently at C3-4 and C4-5. Facet
disease is more prominent left than right at C2-3 and C3-4 with
fusion across the facet joints at C2-3 bilaterally. There is
reversal of the cervical lordosis superiorly.

Other neck: Soft tissues of the neck are otherwise unremarkable. The
thyroid is within normal limits. The lung apices are clear. No
significant adenopathy is present. Salivary glands are within normal
limits.

CTA HEAD

Anterior circulation: Minimal atherosclerotic calcifications are
evident within the left cavernous internal carotid artery. There is
no significant stenosis or vascular injury. The terminal ICA is
normal bilaterally. The A1 and M1 segments are normal. The anterior
communicating artery is patent. The MCA bifurcations are intact. ACA
and MCA branch vessels are within normal limits.

Posterior circulation: The left vertebral artery is the dominant
vessel. PICA origins are visualized and normal. The basilar artery
is within normal limits. Both posterior cerebral arteries originate
from the basilar tip. The PCA branch vessels are within normal
limits.

Venous sinuses: The dural sinuses are patent. The straight sinus and
deep cerebral veins are within normal limits. The cortical veins are
intact.

Anatomic variants: None

Delayed phase: No pathologic enhancement is evident.
IMPRESSION: 1. Minimal atherosclerotic changes at the left cavernous internal
carotid artery and left carotid bifurcation without significant
stenosis.
2. No large vessel occlusion.
3. Normal variant circle of Willis without significant proximal
stenosis, aneurysm, or branch vessel occlusion.

## 2016-06-16 IMAGING — CT CT HEAD W/O CM
2 series · 16 of 30 positions shown, 18 images · non-contrast
Comparison: None.

CLINICAL DATA: Code stroke.  Left-sided weakness and numbness

EXAM:
CT HEAD WITHOUT CONTRAST
TECHNIQUE: Contiguous axial images were obtained from the base of the skull
through the vertex without intravenous contrast.

[Series 201: head w/o, idose (1) · axial · non-contrast · 0.49mm/px · z∈[+71,+191]mm · 8 of 32 slices shown, 10 images]
[im 4/32  brain]
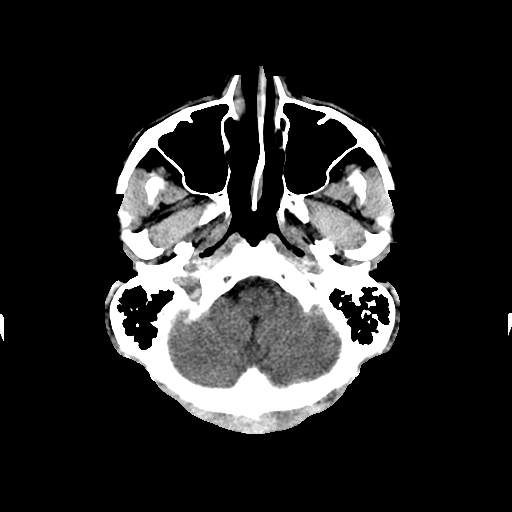
[im 4/32  bone]
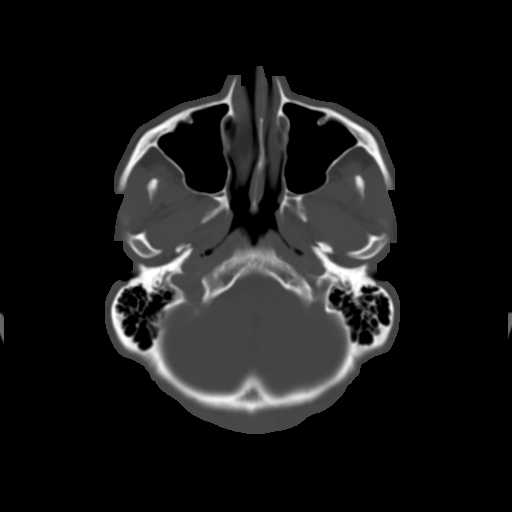
[im 7/32  brain]
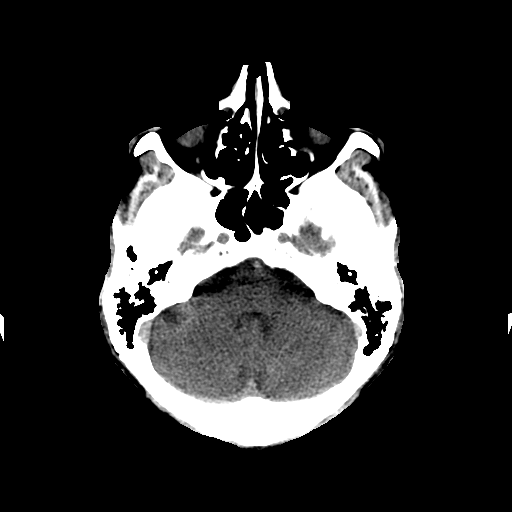
[im 11/32  brain]
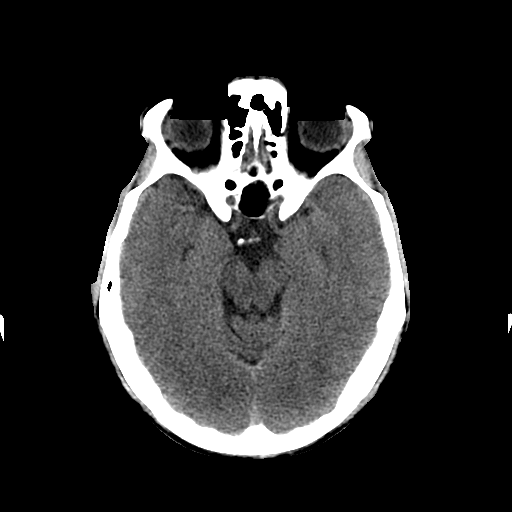
[im 14/32  brain]
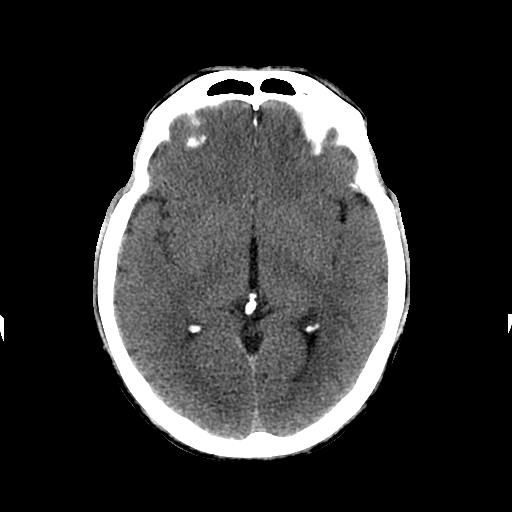
[im 18/32  brain]
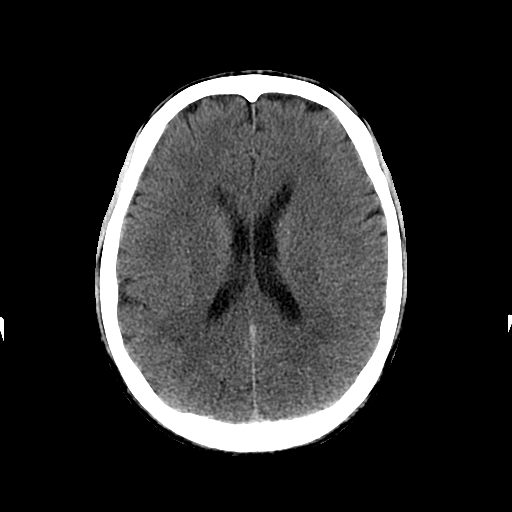
[im 18/32  bone]
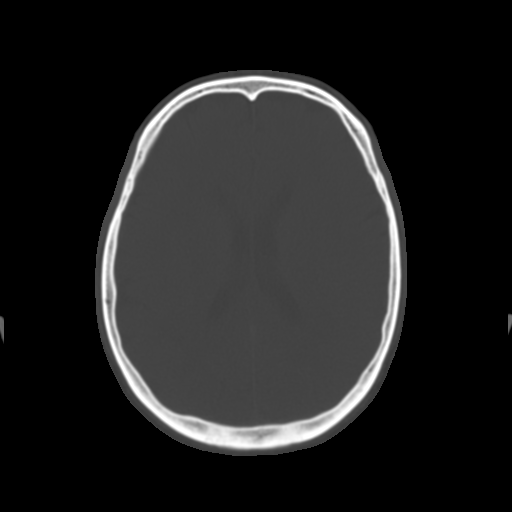
[im 21/32  brain]
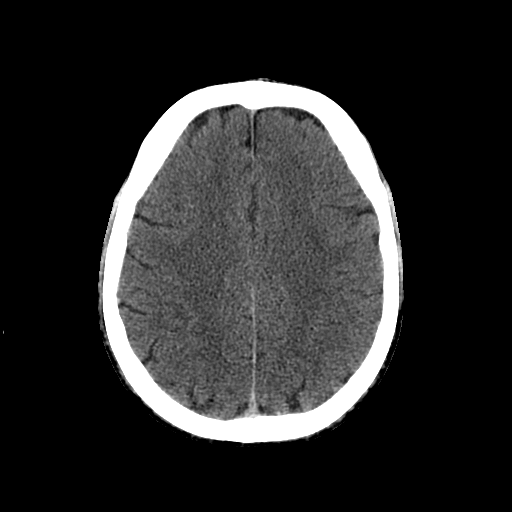
[im 25/32  brain]
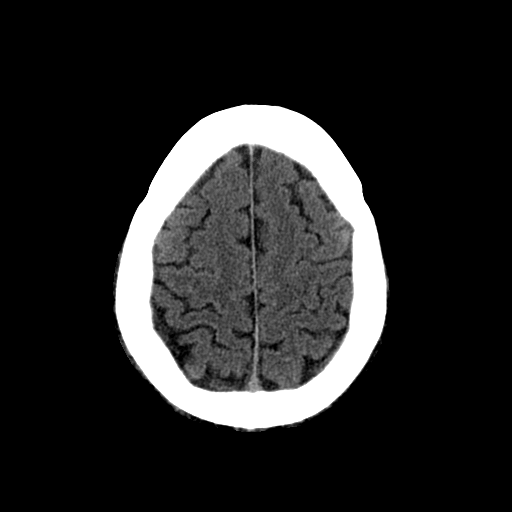
[im 28/32  brain]
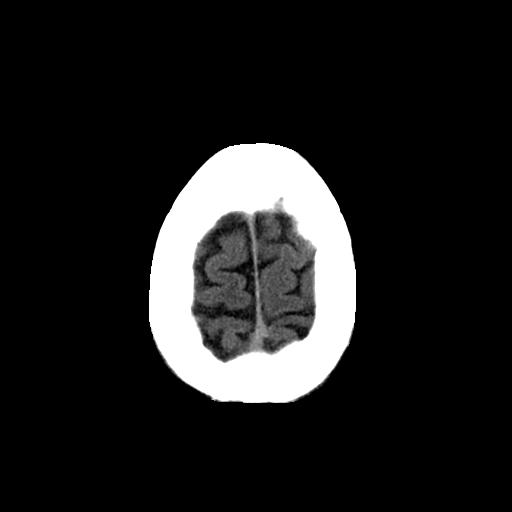

[Series 202: head w/o bone, idose (1) · axial · non-contrast · 0.49mm/px · z∈[+70,+195]mm · 8 of 64 slices shown]
[im 7/64  bone]
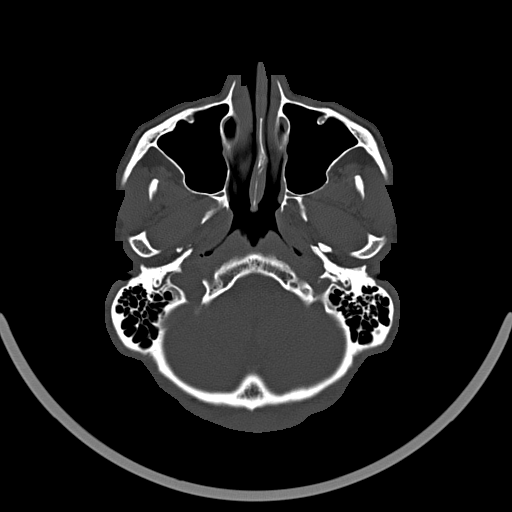
[im 14/64  bone]
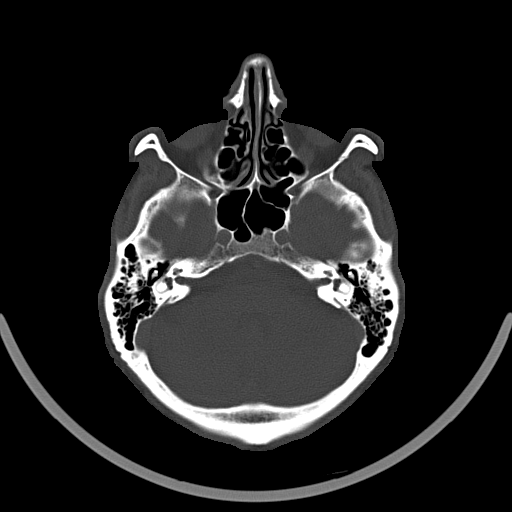
[im 20/64  bone]
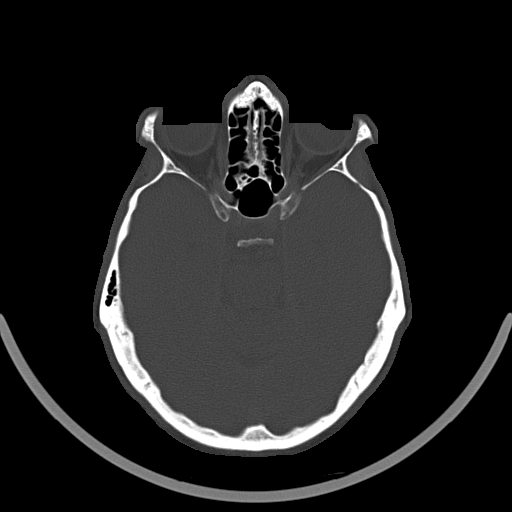
[im 27/64  bone]
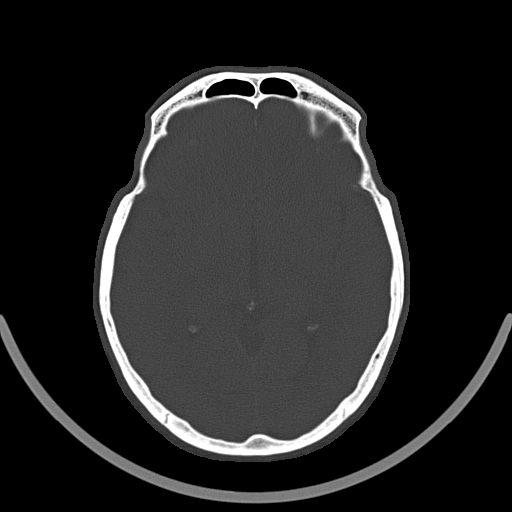
[im 37/64  bone]
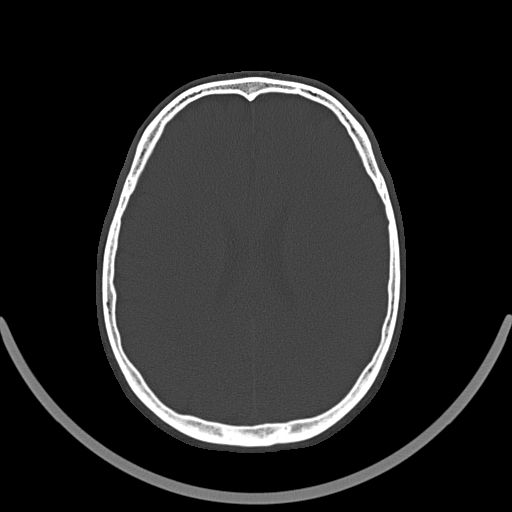
[im 44/64  bone]
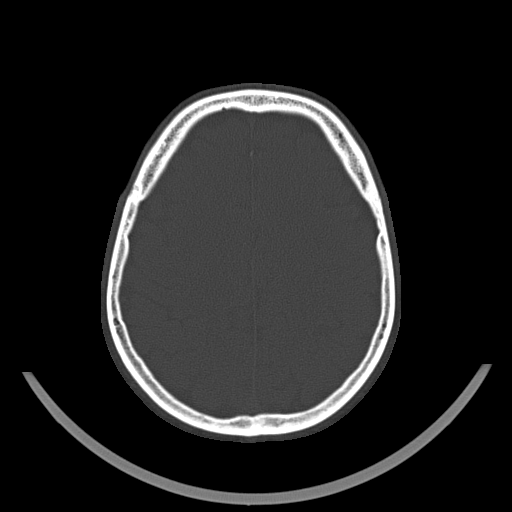
[im 50/64  bone]
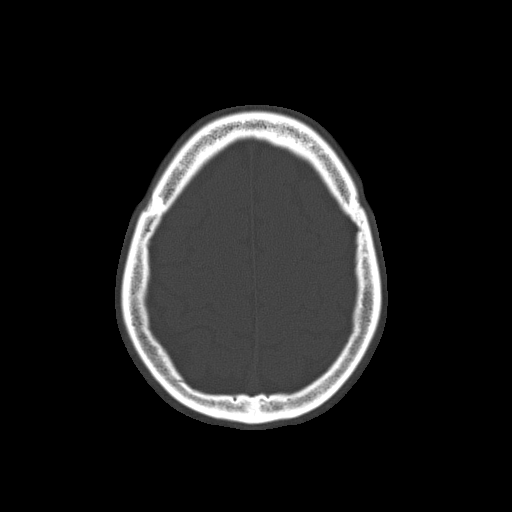
[im 57/64  bone]
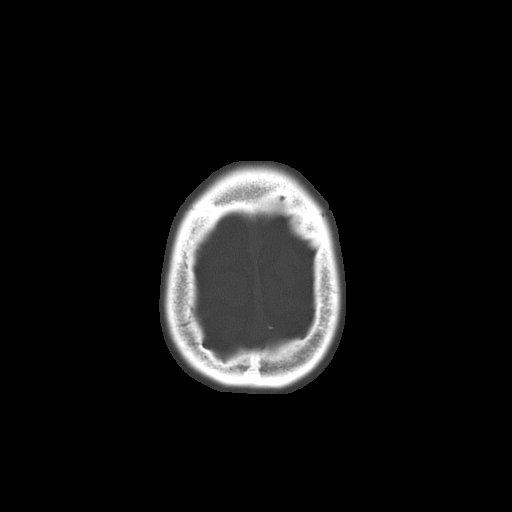

[16 of 30 positions shown; findings below may reference images not displayed]

FINDINGS: Ventricle size normal. Mild chronic microvascular ischemic change in
the white matter.

Negative for acute infarct.  Negative for hemorrhage or mass.

Calvarium intact.
IMPRESSION: Mild chronic microvascular ischemic change in the white matter. No
acute abnormality.

Critical Value/emergent results were called by telephone at the time
of interpretation on 03/19/2015 at [DATE] to Dr. CHMT WM , who
verbally acknowledged these results.

## 2019-12-30 ENCOUNTER — Other Ambulatory Visit: Payer: Self-pay | Admitting: Neurological Surgery

## 2019-12-30 DIAGNOSIS — M545 Low back pain, unspecified: Secondary | ICD-10-CM

## 2020-01-22 ENCOUNTER — Ambulatory Visit
Admission: RE | Admit: 2020-01-22 | Discharge: 2020-01-22 | Disposition: A | Discharge status: Home / Self Care | Payer: Managed Care, Other (non HMO) | Source: Ambulatory Visit | Attending: Neurological Surgery | Admitting: Neurological Surgery

## 2020-01-22 DIAGNOSIS — M545 Low back pain, unspecified: Secondary | ICD-10-CM

## 2022-08-08 ENCOUNTER — Emergency Department (HOSPITAL_COMMUNITY): Payer: Managed Care, Other (non HMO)

## 2022-08-08 ENCOUNTER — Inpatient Hospital Stay (HOSPITAL_COMMUNITY): Payer: Managed Care, Other (non HMO)

## 2022-08-08 ENCOUNTER — Other Ambulatory Visit: Payer: Self-pay

## 2022-08-08 ENCOUNTER — Inpatient Hospital Stay (HOSPITAL_COMMUNITY)
Admission: EM | Admit: 2022-08-08 | Discharge: 2022-08-10 | DRG: 063 | Disposition: A | Discharge status: Home / Self Care | Payer: Managed Care, Other (non HMO) | Attending: Neurology | Admitting: Neurology

## 2022-08-08 DIAGNOSIS — E785 Hyperlipidemia, unspecified: Secondary | ICD-10-CM | POA: Diagnosis present

## 2022-08-08 DIAGNOSIS — J45909 Unspecified asthma, uncomplicated: Secondary | ICD-10-CM | POA: Diagnosis present

## 2022-08-08 DIAGNOSIS — Z79899 Other long term (current) drug therapy: Secondary | ICD-10-CM

## 2022-08-08 DIAGNOSIS — I6789 Other cerebrovascular disease: Secondary | ICD-10-CM | POA: Diagnosis present

## 2022-08-08 DIAGNOSIS — I6381 Other cerebral infarction due to occlusion or stenosis of small artery: Secondary | ICD-10-CM | POA: Diagnosis present

## 2022-08-08 DIAGNOSIS — I6389 Other cerebral infarction: Secondary | ICD-10-CM

## 2022-08-08 DIAGNOSIS — I6782 Cerebral ischemia: Secondary | ICD-10-CM | POA: Diagnosis not present

## 2022-08-08 DIAGNOSIS — H532 Diplopia: Secondary | ICD-10-CM | POA: Diagnosis present

## 2022-08-08 DIAGNOSIS — F419 Anxiety disorder, unspecified: Secondary | ICD-10-CM | POA: Diagnosis present

## 2022-08-08 DIAGNOSIS — Z880 Allergy status to penicillin: Secondary | ICD-10-CM | POA: Diagnosis not present

## 2022-08-08 DIAGNOSIS — G459 Transient cerebral ischemic attack, unspecified: Secondary | ICD-10-CM

## 2022-08-08 DIAGNOSIS — R112 Nausea with vomiting, unspecified: Secondary | ICD-10-CM | POA: Diagnosis present

## 2022-08-08 DIAGNOSIS — I639 Cerebral infarction, unspecified: Secondary | ICD-10-CM

## 2022-08-08 DIAGNOSIS — Z7982 Long term (current) use of aspirin: Secondary | ICD-10-CM | POA: Diagnosis not present

## 2022-08-08 DIAGNOSIS — Z8673 Personal history of transient ischemic attack (TIA), and cerebral infarction without residual deficits: Secondary | ICD-10-CM

## 2022-08-08 DIAGNOSIS — R42 Dizziness and giddiness: Secondary | ICD-10-CM | POA: Diagnosis present

## 2022-08-08 DIAGNOSIS — R29701 NIHSS score 1: Secondary | ICD-10-CM | POA: Diagnosis present

## 2022-08-08 DIAGNOSIS — I10 Essential (primary) hypertension: Secondary | ICD-10-CM | POA: Diagnosis present

## 2022-08-08 HISTORY — DX: Type 2 diabetes mellitus without complications: E11.9

## 2022-08-08 LAB — ECHOCARDIOGRAM COMPLETE
AR max vel: 3.45 cm2
AV Area VTI: 3.48 cm2
AV Area mean vel: 3.21 cm2
AV Mean grad: 4 mmHg
AV Peak grad: 7.3 mmHg
Ao pk vel: 1.35 m/s
Area-P 1/2: 3.77 cm2
Height: 71 in
MV VTI: 4.4 cm2
S' Lateral: 3.55 cm
Weight: 3139.35 oz

## 2022-08-08 LAB — PROTIME-INR
INR: 1 (ref 0.8–1.2)
Prothrombin Time: 12.6 seconds (ref 11.4–15.2)

## 2022-08-08 LAB — DIFFERENTIAL
Abs Immature Granulocytes: 0.01 10*3/uL (ref 0.00–0.07)
Basophils Absolute: 0 10*3/uL (ref 0.0–0.1)
Basophils Relative: 1 %
Eosinophils Absolute: 0.1 10*3/uL (ref 0.0–0.5)
Eosinophils Relative: 1 %
Immature Granulocytes: 0 %
Lymphocytes Relative: 37 %
Lymphs Abs: 2.7 10*3/uL (ref 0.7–4.0)
Monocytes Absolute: 0.7 10*3/uL (ref 0.1–1.0)
Monocytes Relative: 9 %
Neutro Abs: 3.9 10*3/uL (ref 1.7–7.7)
Neutrophils Relative %: 52 %

## 2022-08-08 LAB — I-STAT CHEM 8, ED
BUN: 23 mg/dL — ABNORMAL HIGH (ref 6–20)
Calcium, Ion: 1.11 mmol/L — ABNORMAL LOW (ref 1.15–1.40)
Chloride: 104 mmol/L (ref 98–111)
Creatinine, Ser: 0.8 mg/dL (ref 0.61–1.24)
Glucose, Bld: 142 mg/dL — ABNORMAL HIGH (ref 70–99)
HCT: 45 % (ref 39.0–52.0)
Hemoglobin: 15.3 g/dL (ref 13.0–17.0)
Potassium: 3.8 mmol/L (ref 3.5–5.1)
Sodium: 139 mmol/L (ref 135–145)
TCO2: 22 mmol/L (ref 22–32)

## 2022-08-08 LAB — COMPREHENSIVE METABOLIC PANEL
ALT: 29 U/L (ref 0–44)
AST: 32 U/L (ref 15–41)
Albumin: 4.1 g/dL (ref 3.5–5.0)
Alkaline Phosphatase: 39 U/L (ref 38–126)
Anion gap: 7 (ref 5–15)
BUN: 20 mg/dL (ref 6–20)
CO2: 25 mmol/L (ref 22–32)
Calcium: 8.9 mg/dL (ref 8.9–10.3)
Chloride: 106 mmol/L (ref 98–111)
Creatinine, Ser: 0.89 mg/dL (ref 0.61–1.24)
GFR, Estimated: >60 mL/min (ref >60)
Glucose, Bld: 141 mg/dL — ABNORMAL HIGH (ref 70–99)
Potassium: 3.8 mmol/L (ref 3.5–5.1)
Sodium: 138 mmol/L (ref 135–145)
Total Bilirubin: 0.9 mg/dL (ref 0.3–1.2)
Total Protein: 6.9 g/dL (ref 6.5–8.1)

## 2022-08-08 LAB — CBC
HCT: 44.7 % (ref 39.0–52.0)
Hemoglobin: 15.4 g/dL (ref 13.0–17.0)
MCH: 32 pg (ref 26.0–34.0)
MCHC: 34.5 g/dL (ref 30.0–36.0)
MCV: 92.9 fL (ref 80.0–100.0)
Platelets: 232 10*3/uL (ref 150–400)
RBC: 4.81 MIL/uL (ref 4.22–5.81)
RDW: 12.5 % (ref 11.5–15.5)
WBC: 7.4 10*3/uL (ref 4.0–10.5)
nRBC: 0 % (ref 0.0–0.2)

## 2022-08-08 LAB — ETHANOL: Alcohol, Ethyl (B): <10 mg/dL (ref <10)

## 2022-08-08 LAB — CBG MONITORING, ED: Glucose-Capillary: 131 mg/dL — ABNORMAL HIGH (ref 70–99)

## 2022-08-08 LAB — GLUCOSE, CAPILLARY: Glucose-Capillary: 100 mg/dL — ABNORMAL HIGH (ref 70–99)

## 2022-08-08 LAB — APTT: aPTT: 21 seconds — ABNORMAL LOW (ref 24–36)

## 2022-08-08 LAB — MRSA NEXT GEN BY PCR, NASAL: MRSA by PCR Next Gen: NOT DETECTED

## 2022-08-08 MED ORDER — PANTOPRAZOLE SODIUM 40 MG IV SOLR
40.0000 mg | Freq: Every day | INTRAVENOUS | Status: DC
  Administered 2022-08-08: 40 mg via INTRAVENOUS
  Filled 2022-08-08: qty 10

## 2022-08-08 MED ORDER — ONDANSETRON HCL 4 MG/2ML IJ SOLN
4.0000 mg | Freq: Once | INTRAMUSCULAR | Status: AC
  Administered 2022-08-08: 4 mg via INTRAVENOUS

## 2022-08-08 MED ORDER — INSULIN ASPART 100 UNIT/ML IJ SOLN: 0.0000 [IU] | Freq: Three times a day (TID) | INTRAMUSCULAR | Status: DC

## 2022-08-08 MED ORDER — TENECTEPLASE FOR STROKE
0.2500 mg/kg | PACK | Freq: Once | INTRAVENOUS | Status: AC
  Administered 2022-08-08: 22 mg via INTRAVENOUS
  Filled 2022-08-08: qty 10

## 2022-08-08 MED ORDER — SENNOSIDES-DOCUSATE SODIUM 8.6-50 MG PO TABS: 1.0000 | ORAL_TABLET | Freq: Every evening | ORAL | Status: DC | PRN

## 2022-08-08 MED ORDER — SODIUM CHLORIDE 0.9% FLUSH
3.0000 mL | Freq: Once | INTRAVENOUS | Status: AC
  Administered 2022-08-08: 3 mL via INTRAVENOUS

## 2022-08-08 MED ORDER — CHLORHEXIDINE GLUCONATE CLOTH 2 % EX PADS
6.0000 | MEDICATED_PAD | Freq: Every day | CUTANEOUS | Status: DC
  Administered 2022-08-09 – 2022-08-10 (×2): 6 via TOPICAL

## 2022-08-08 MED ORDER — SODIUM CHLORIDE 0.9 % IV SOLN: INTRAVENOUS | Status: DC

## 2022-08-08 MED ORDER — STROKE: EARLY STAGES OF RECOVERY BOOK
Freq: Once | Status: AC
  Filled 2022-08-08: qty 1

## 2022-08-08 MED ORDER — ACETAMINOPHEN 160 MG/5ML PO SOLN: 650.0000 mg | ORAL | Status: DC | PRN

## 2022-08-08 MED ORDER — ACETAMINOPHEN 650 MG RE SUPP: 650.0000 mg | RECTAL | Status: DC | PRN

## 2022-08-08 MED ORDER — ACETAMINOPHEN 325 MG PO TABS: 650.0000 mg | ORAL_TABLET | ORAL | Status: DC | PRN

## 2022-08-08 MED ORDER — IOHEXOL 350 MG/ML SOLN
75.0000 mL | Freq: Once | INTRAVENOUS | Status: AC | PRN
  Administered 2022-08-08: 75 mL via INTRAVENOUS

## 2022-08-08 MED ORDER — ORAL CARE MOUTH RINSE: 15.0000 mL | OROMUCOSAL | Status: DC | PRN

## 2022-08-08 MED ORDER — LORAZEPAM 2 MG/ML IJ SOLN
2.0000 mg | Freq: Once | INTRAMUSCULAR | Status: AC
  Administered 2022-08-08: 2 mg via INTRAVENOUS
  Filled 2022-08-08: qty 1

## 2022-08-08 NOTE — H&P (Addendum)
Neurology H&P  Reason for Consult: Code Stroke Referring Physician: Taran Hable  CC: Dizziness  History is obtained from:Patient, chart review  HPI: Guy Thomas is a 56 y.o. male with a PMH of HLD, anxiety, and syncope presenting with an acute onset of dizziness starting at 0900 while at work.  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  EMS was called.  Blood pressure with EMS elevated at 160/80.  CBG 116.  He did have 2 episodes of vomiting in the ambulance and is endorsing dizziness, nausea, visual disturbance.  He denies numbness, tingling, or focal weakness.  Of note, he had a presentation in 2016 with left-sided weakness and numbness as well as some right-sided symptoms which was ultimately felt to be most likely related to anxiety versus musculoskeletal issue.  LKW: 0900 IV thrombolysis given?: Yes Management with thrombolytic therapy was explained to the patient, or patient's representative, as were risks, benefits and alternatives. All questions were answered. Patient, or patient's representative, expressed understanding of the treatment plan and agreed to proceed with thrombolytic treatment.  Premorbid modified Rankin scale (mRS):  IR: No, LVO not suspected 0-Completely asymptomatic and back to baseline post-stroke  ROS: Full ROS was performed and is negative except as noted in the HPI.   Past Medical History:  Diagnosis Date   Asthma    No family history on file.  Social History:   reports that he has never smoked. He does not have any smokeless tobacco history on file. He reports that he does not drink alcohol and does not use drugs.  Medications  Current Facility-Administered Medications:    sodium chloride flush (NS) 0.9 % injection 3 mL, 3 mL, Intravenous, Once, Gloris Manchester, MD  Current Outpatient Medications:    ALPRAZolam (XANAX) 0.5 MG tablet, Take 0.25 mg by mouth at bedtime as needed for sleep (sleep). ,  Disp: , Rfl:    ANDROGEL PUMP 20.25 MG/ACT (1.62%) GEL, Apply 2 application topically daily. , Disp: , Rfl: 0   aspirin EC 325 MG tablet, Take 325 mg by mouth daily., Disp: , Rfl:    diclofenac (VOLTAREN) 75 MG EC tablet, Take 1 tablet (75 mg total) by mouth 2 (two) times daily., Disp: 50 tablet, Rfl: 2   Multiple Vitamin (MULTIVITAMIN) tablet, Take 1 tablet by mouth daily., Disp: , Rfl:    rosuvastatin (CRESTOR) 10 MG tablet, Take 10 mg by mouth daily., Disp: , Rfl:   Exam: Current vital signs: Wt 89 kg   BMI 27.37 kg/m  Vital signs in last 24 hours: Weight:  [89 kg] 89 kg (11/27 0900)  GENERAL: Awake, alert in NAD HEENT: - Normocephalic and atraumatic, dry mm, no LN++, no Thyromegally LUNGS - Clear to auscultation bilaterally with no wheezes CV - S1S2 RRR, no m/r/g, equal pulses bilaterally. ABDOMEN - Soft, nontender, nondistended with normoactive BS Ext: warm, well perfused, intact peripheral pulses, no edema  NEURO:  Mental Status: AA&Ox2- states age incorrectly Language: speech is clear.  Naming, repetition, fluency, and comprehension intact. Cranial Nerves: PERRL, reports horizontal triple vision, direction changing nystagmus- torsional nystagmus towards the ground with right gaze, left beating nystagmus with leftward gaze, slower left beating nystagmus in primary gaze, left beating and torsional nystagmus on upgaze visual fields full, no facial asymmetry, facial sensation intact, hearing intact, tongue/uvula/soft palate midline, normal sternocleidomastoid and trapezius muscle strength. No evidence of tongue atrophy or fibrillations Motor: Moves all extremities antigravity Tone: is normal and bulk is  normal Sensation- Intact to light touch bilaterally Coordination: FTN intact bilaterally, no ataxia in BLE. Gait- deferred  NIHSS 1a Level of Conscious.: 0 1b LOC Questions: 1 1c LOC Commands: 0 2 Best Gaze: 0 3 Visual: 0 4 Facial Palsy: 0 5a Motor Arm - left: 0 5b Motor  Arm - Right: 0 6a Motor Leg - Left: 0 6b Motor Leg - Right: 0 7 Limb Ataxia: 0 8 Sensory: 0 9 Best Language: 0 10 Dysarthria: 0 11 Extinct. and Inatten.: 0 TOTAL: 1   Labs I have reviewed labs in epic and the results pertinent to this consultation are:  CBC    Component Value Date/Time   WBC 6.2 03/19/2015 0728   RBC 4.99 03/19/2015 0728   HGB 16.3 03/19/2015 0738   HCT 48.0 03/19/2015 0738   PLT 182 03/19/2015 0728   MCV 90.8 03/19/2015 0728   MCH 31.1 03/19/2015 0728   MCHC 34.2 03/19/2015 0728   RDW 12.8 03/19/2015 0728   LYMPHSABS 2.6 03/19/2015 0728   MONOABS 0.6 03/19/2015 0728   EOSABS 0.1 03/19/2015 0728   BASOSABS 0.0 03/19/2015 0728    CMP     Component Value Date/Time   NA 138 03/19/2015 0738   K 3.7 03/19/2015 0738   CL 100 (L) 03/19/2015 0738   CO2 26 03/19/2015 0728   GLUCOSE 97 03/19/2015 0738   BUN 19 03/19/2015 0738   CREATININE 0.80 03/19/2015 0738   CALCIUM 8.7 (L) 03/19/2015 0728   PROT 6.6 03/19/2015 0728   ALBUMIN 3.9 03/19/2015 0728   AST 43 (H) 03/19/2015 0728   ALT 31 03/19/2015 0728   ALKPHOS 45 03/19/2015 0728   BILITOT 1.0 03/19/2015 0728   GFRNONAA >60 03/19/2015 0728   GFRAA >60 03/19/2015 0728    Lipid Panel     Component Value Date/Time   CHOL 151 03/20/2015 0536   TRIG 90 03/20/2015 0536   HDL 48 03/20/2015 0536   CHOLHDL 3.1 03/20/2015 0536   VLDL 18 03/20/2015 0536   LDLCALC 85 03/20/2015 0536     Imaging I have reviewed the images obtained:  CT-head no acute abnormality CTA head and neck 1. Mild atherosclerosis in the head and neck without a large vessel occlusion or significant proximal intracranial stenosis. 2. New, mild stenosis of the right vertebral artery origin. 3.  Aortic Atherosclerosis (ICD10-I70.0). MRI examination of the brain pending  Assessment: 56 year old male presenting with dizziness and visual disturbance.  Past medical history includes a TIA, hyperlipidemia, asthma who experienced an  acute onset of dizziness at 9 AM this morning while working on a trailer.  Additionally he endorses double vision.  TNK was given and he will be admitted to ICU for further monitoring and a stroke workup.  Despite low NIH he had significant disabling symptoms given inability to ambulate secondary to vertigo.  Etiology would likely be small vessel disease versus atheroembolic, given localization is brainstem, MRI may be negative in this case even in the setting of an acute ischemic event  Impression: Acute labyrinthitis versus acute ischemic stroke  Plan: Acute Ischemic Stroke - Admit to: Neuro ICU, stroke team -Hold Aspirin until 24 hour post tPA neuroimaging is stable and without evidence of bleeding -Blood pressure control, goal of <180/105  -MRI/ECHO/A1C/Lipid panel. -Hyperglycemia management per SSI to maintain glucose 140-180mg /dL. -PT/OT/ST therapies and recommendations when able  CNS -Close neuro monitoring -NPO until cleared by speech -ST -PT/OT   RESP -Monitor  CV Essential (primary) hypertension -Aggressive BP control, goal BP <  180/105 -Titrate oral agents Hyperlipidemia, unspecified  - Statin for goal LDL < 70  HEME -Monitor -Transfuse for hgb < 7  Coagulopathy secondary to TNK administration  ENDO -goal HgbA1c < 7%  GI/GU -Gentle hydration  Fluid/Electrolyte Disorders -Replete  ID -Monitor  Nutrition N.p.o. until swallow screen is done  Prophylaxis DVT:  SCDs GI: PPI Bowel: Docusate / Senna  Diet: NPO until cleared by speech  Code Status: Full Code    -- Patient seen and examined by NP/APP with MD. MD to update note as needed.   Elmer Picker, DNP, FNP-BC [facility] Pager: [phone]  Attending Neurologist's note:  I personally saw this patient, gathering history, performing a full neurologic examination, reviewing relevant labs, personally reviewing relevant imaging including head CT and CTA, and formulated the  assessment and plan, adding the note above for completeness and clarity to accurately reflect my thoughts  Brooke Dare MD-PhD [facility] [phone] Available 7 AM to 7 PM, outside these hours please contact Neurologist on call listed on AMION   CRITICAL CARE Performed by: Gordy Councilman   Total critical care time: 35 minutes  Critical care time was exclusive of separately billable procedures and treating other patients.  Critical care was necessary to treat or prevent imminent or life-threatening deterioration.  Critical care was time spent personally by me on the following activities: development of treatment plan with patient and/or surrogate as well as nursing, discussions with consultants, evaluation of patient's response to treatment, examination of patient, obtaining history from patient or surrogate, ordering and performing treatments and interventions, ordering and review of laboratory studies, ordering and review of radiographic studies, pulse oximetry and re-evaluation of patient's condition.

## 2022-08-08 NOTE — ED Triage Notes (Signed)
Pt BIB EMS from work as an activated code stroke, initially called out for blurred vision. Sudden onset of 0900. Vomiting x2, dizziness. No history of HTN. Pt reports being normal this morning prior to sudden onset.   EMS Vitals 160/80 CBG 116 HR 70 98% on RA A&O x4  18# L AC

## 2022-08-08 NOTE — ED Provider Notes (Signed)
[place] EMERGENCY DEPARTMENT Provider Note   CSN: [id number] Arrival date & time: 08/08/22  [id number]  An emergency department physician performed an initial assessment on this suspected stroke patient at 0953.  History  Chief Complaint  Patient presents with   Code Stroke    Guy Thomas is a 56 y.o. male.  HPI Patient presents for dizziness and visual disturbance.  Medical history includes TIA, HLD, seizures, asthma.  He was in his normal state of health earlier this morning.  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  EMS was called.  EMS reports that he was able to ambulate with steady gait.  No focal neurologic deficits were identified on exam on site.  CBG was 116.  Blood pressure was moderately elevated.  Patient had 2 episodes of vomiting.  On arrival, he continues to endorse symptoms of dizziness and visual disturbance.    Home Medications Prior to Admission medications   Medication Sig Start Date End Date Taking? Authorizing Provider  ALPRAZolam Duanne Moron) 0.5 MG tablet Take 0.25 mg by mouth at bedtime as needed for sleep (sleep).     [provider]  ANDROGEL PUMP 20.25 MG/ACT (1.62%) GEL Apply 2 application topically daily.  03/12/15   [provider]  aspirin EC 325 MG tablet Take 325 mg by mouth daily.    [provider]  diclofenac (VOLTAREN) 75 MG EC tablet Take 1 tablet (75 mg total) by mouth 2 (two) times daily. 08/13/14   Wallene Huh, DPM  Multiple Vitamin (MULTIVITAMIN) tablet Take 1 tablet by mouth daily.    [provider]  rosuvastatin (CRESTOR) 10 MG tablet Take 10 mg by mouth daily.    [provider]      Allergies    Penicillins    Review of Systems   Review of Systems  Eyes:  Positive for visual disturbance.  Gastrointestinal:  Positive for nausea and vomiting.  Neurological:  Positive for dizziness.  All  other systems reviewed and are negative.   Physical Exam Updated Vital Signs BP 136/81   Pulse (!) 57   Temp 97.8 F (36.6 C)   Resp 14   Ht 5\' 11"  (1.803 m)   Wt 89 kg   SpO2 97%   BMI 27.37 kg/m  Physical Exam Vitals and nursing note reviewed.  Constitutional:      General: He is not in acute distress.    Appearance: Normal appearance. He is well-developed and normal weight. He is not ill-appearing, toxic-appearing or diaphoretic.  HENT:     Head: Normocephalic and atraumatic.     Right Ear: External ear normal.     Left Ear: External ear normal.     Nose: Nose normal.     Mouth/Throat:     Mouth: Mucous membranes are moist.  Cardiovascular:     Rate and Rhythm: Normal rate and regular rhythm.  Pulmonary:     Effort: Pulmonary effort is normal. No respiratory distress.  Abdominal:     General: There is no distension.  Musculoskeletal:        General: No swelling. Normal range of motion.     Cervical back: Normal range of motion and neck supple.     Right lower leg: No edema.     Left lower leg: No edema.  Skin:    General: Skin is warm and dry.     Coloration:  Skin is not jaundiced or pale.  Neurological:     Mental Status: He is alert and oriented to person, place, and time.     Sensory: No sensory deficit.     Motor: No weakness.     Coordination: Coordination normal.  Psychiatric:        Mood and Affect: Mood normal.        Behavior: Behavior normal.     ED Results / Procedures / Treatments   Labs (all labs ordered are listed, but only abnormal results are displayed) Labs Reviewed  APTT - Abnormal; Notable for the following components:      Result Value   aPTT 21 (*)    All other components within normal limits  I-STAT CHEM 8, ED - Abnormal; Notable for the following components:   BUN 23 (*)    Glucose, Bld 142 (*)    Calcium, Ion 1.11 (*)    All other components within normal limits  CBG MONITORING, ED - Abnormal; Notable for the following  components:   Glucose-Capillary 131 (*)    All other components within normal limits  PROTIME-INR  CBC  DIFFERENTIAL  COMPREHENSIVE METABOLIC PANEL  ETHANOL  HIV ANTIBODY (ROUTINE TESTING W REFLEX)  HEMOGLOBIN A1C    EKG None  Radiology CT ANGIO HEAD NECK W WO CM (CODE STROKE)  Result Date: 08/08/2022 CLINICAL DATA:  Neuro deficit, acute, stroke suspected. Acute blurred vision, dizziness, and vomiting. EXAM: CT ANGIOGRAPHY HEAD AND NECK TECHNIQUE: Multidetector CT imaging of the head and neck was performed using the standard protocol during bolus administration of intravenous contrast. Multiplanar CT image reconstructions and MIPs were obtained to evaluate the vascular anatomy. Carotid stenosis measurements (when applicable) are obtained utilizing NASCET criteria, using the distal internal carotid diameter as the denominator. RADIATION DOSE REDUCTION: This exam was performed according to the departmental dose-optimization program which includes automated exposure control, adjustment of the mA and/or kV according to patient size and/or use of iterative reconstruction technique. CONTRAST:  74mL OMNIPAQUE IOHEXOL 350 MG/ML SOLN COMPARISON:  Head and neck CTA 03/19/2015 FINDINGS: CTA NECK FINDINGS Aortic arch: Standard 3 vessel aortic arch with mild atherosclerotic plaque. Widely patent arch vessel origins. Right carotid system: Patent without evidence of stenosis, dissection, or significant atherosclerosis. Left carotid system: Patent with a small amount of calcified plaque at the carotid bifurcation. No evidence of a significant stenosis or dissection. Vertebral arteries: Patent with the left being dominant. Increased calcified plaque at the right vertebral origin results in new mild stenosis. Skeleton: Advanced disc degeneration at C4-5. Asymmetrically severe left facet arthrosis at C3-4. Interbody and facet ankylosis at C2-3. Other neck: No evidence of cervical lymphadenopathy or mass. Upper  chest: Clear lung apices. Review of the MIP images confirms the above findings CTA HEAD FINDINGS Anterior circulation: The internal carotid arteries are patent from skull base to carotid termini with minimal atherosclerotic plaque not resulting in significant stenosis. ACAs and MCAs are patent without evidence of a proximal branch occlusion or significant proximal stenosis. No aneurysm is identified. Posterior circulation: The intracranial vertebral arteries are patent to the basilar with the left being strongly dominant. Patent PICA and SCA origins are seen bilaterally. The basilar artery is widely patent. Posterior communicating arteries are diminutive or absent. The PCAs are patent without evidence of a significant proximal stenosis. No aneurysm is identified. Venous sinuses: Patent. Anatomic variants: None. Review of the MIP images confirms the above findings These results were communicated to Dr. Curly Shores at 10:19 am on  08/08/2022 by text page via the [facility] messaging system. IMPRESSION: 1. Mild atherosclerosis in the head and neck without a large vessel occlusion or significant proximal intracranial stenosis. 2. New, mild stenosis of the right vertebral artery origin. 3.  Aortic Atherosclerosis (ICD10-I70.0). Electronically Signed   By: Sebastian Ache M.D.   On: 08/08/2022 10:29   CT HEAD CODE STROKE WO CONTRAST  Result Date: 08/08/2022 CLINICAL DATA:  Code stroke.  56 year old male. EXAM: CT HEAD WITHOUT CONTRAST TECHNIQUE: Contiguous axial images were obtained from the base of the skull through the vertex without intravenous contrast. RADIATION DOSE REDUCTION: This exam was performed according to the departmental dose-optimization program which includes automated exposure control, adjustment of the mA and/or kV according to patient size and/or use of iterative reconstruction technique. COMPARISON:  Brain MRI and head CT 03/19/2015. FINDINGS: Brain: Cerebral volume remains within normal limits for age. No  midline shift, ventriculomegaly, mass effect, evidence of mass lesion, intracranial hemorrhage or evidence of cortically based acute infarction. Gray-white matter differentiation is within normal limits throughout the brain. Vascular: Mild Calcified atherosclerosis at the skull base. Suspicious asymmetric hyperdensity of the right MCA M1. Skull: Negative. Sinuses/Orbits: Visualized paranasal sinuses and mastoids are clear. Other: Visualized orbits and scalp soft tissues are within normal limits. ASPECTS [facility] Stroke Program Early CT Score) Total score (0-10 with 10 being normal): 10 IMPRESSION: 1. Suspicious asymmetric hyperdensity of the right MCA M1, but Normal noncontrast CT appearance of the brain. ASPECTS 10. 2. These results were communicated to Dr. Iver Nestle at 10:06 am on 08/08/2022 by text page via the [facility] messaging system. Electronically Signed   By: Odessa Fleming M.D.   On: 08/08/2022 10:06    Procedures Procedures    Medications Ordered in ED Medications   stroke: early stages of recovery book (has no administration in time range)  0.9 %  sodium chloride infusion ( Intravenous New Bag/Given 08/08/22 1050)  acetaminophen (TYLENOL) tablet 650 mg (has no administration in time range)    Or  acetaminophen (TYLENOL) 160 MG/5ML solution 650 mg (has no administration in time range)    Or  acetaminophen (TYLENOL) suppository 650 mg (has no administration in time range)  senna-docusate (Senokot-S) tablet 1 tablet (has no administration in time range)  pantoprazole (PROTONIX) injection 40 mg (has no administration in time range)  sodium chloride flush (NS) 0.9 % injection 3 mL (3 mLs Intravenous Given 08/08/22 1030)  tenecteplase (TNKASE) injection for Stroke 22 mg (22 mg Intravenous Given 08/08/22 1009)  iohexol (OMNIPAQUE) 350 MG/ML injection 75 mL (75 mLs Intravenous Contrast Given 08/08/22 1011)  ondansetron (ZOFRAN) injection 4 mg (4 mg Intravenous Given 08/08/22 1191)    ED Course/ Medical  Decision Making/ A&P                           Medical Decision Making Amount and/or Complexity of Data Reviewed Labs: ordered. Radiology: ordered.  Risk Decision regarding hospitalization.   This patient presents to the ED for concern of dizziness and visual disturbance, this involves an extensive number of treatment options, and is a complaint that carries with it a high risk of complications and morbidity.  The differential diagnosis includes CVA, TIA, seizure, vestibular neuritis, Mnire's disease, hypoglycemia, other metabolic derangement   Co morbidities that complicate the patient evaluation  TIA, HLD, seizures, asthma   Additional history obtained:  Additional history obtained from EMS External records from outside source obtained and reviewed including EMR  Lab Tests:  I Ordered, and personally interpreted labs.  The pertinent results include: Normal kidney function, normal electrolytes, normal hemoglobin, no leukocytosis, normal blood glucose   Imaging Studies ordered:  I ordered imaging studies including CT head, CTA head and neck I independently visualized and interpreted imaging which showed no LVO, no ICH I agree with the radiologist interpretation   Cardiac Monitoring: / EKG:  The patient was maintained on a cardiac monitor.  I personally viewed and interpreted the cardiac monitored which showed an underlying rhythm of: Sinus rhythm   Consultations Obtained:  I requested consultation with the neurologist, Dr. Curly Shores,  and discussed lab and imaging findings as well as pertinent plan - they recommend: TNKase and admission to neuro ICU   Problem List / ED Course / Critical interventions / Medication management  Patient is a 56 year old male presenting for cute onset of dizziness and diplopia starting this morning at 9 AM.  EMS noted normal blood glucose and vital signs notable for hypertension prior to arrival.  Patient did have 2 episodes of vomiting  shortly prior to arrival.  He arrives as a code stroke.  On arrival in the ED, patient is alert and oriented.  Airway is intact.  He was taken directly to CT scanner.  Dose of Zofran was given for nausea.  He underwent CT imaging and neurology evaluation at bedside.  Neurologist on-call, Dr. Curly Shores, ordered [facility].  Plan will be for admission to neuro ICU.  Laboratory work-up is unremarkable.  Patient was admitted for further management. I ordered medication including Zofran for nausea Reevaluation of the patient after these medicines showed that the patient improved I have reviewed the patients home medicines and have made adjustments as needed   Social Determinants of Health:  Has PCP  CRITICAL CARE Performed by: Godfrey Pick   Total critical care time: 32 minutes  Critical care time was exclusive of separately billable procedures and treating other patients.  Critical care was necessary to treat or prevent imminent or life-threatening deterioration.  Critical care was time spent personally by me on the following activities: development of treatment plan with patient and/or surrogate as well as nursing, discussions with consultants, evaluation of patient's response to treatment, examination of patient, obtaining history from patient or surrogate, ordering and performing treatments and interventions, ordering and review of laboratory studies, ordering and review of radiographic studies, pulse oximetry and re-evaluation of patient's condition.          Final Clinical Impression(s) / ED Diagnoses Final diagnoses:  Dizziness    Rx / DC Orders ED Discharge Orders     None         Godfrey Pick, MD 08/08/22 1105

## 2022-08-08 NOTE — Progress Notes (Incomplete)
Echocardiogram 2D Echocardiogram has been performed.  Guy Thomas 08/08/2022, 1:37 PM

## 2022-08-08 NOTE — Progress Notes (Signed)
PHARMACIST CODE STROKE RESPONSE  Notified to mix TNK at 10:05 by Dr. Iver Nestle TNK preparation completed at 10:08  TNK dose = 22 mg IV over 5 seconds.   Issues/delays encountered (if applicable): N/A  Rockwell Alexandria, PharmD, [facility] PGY1 Pharmacy Resident 08/08/2022 10:09 AM

## 2022-08-08 NOTE — Code Documentation (Signed)
Stroke Response Nurse Documentation Code Documentation  Guy Thomas is a 56 y.o. male arriving to [facility]  via [facility] on 08/08/2022 with no major past medical hx. Code stroke was activated by EMS.   Patient from work where he was LKW at 0900 and now complaining of sudden onset of double vision and dizziness along with two episodes of N/V.  Stroke team at the bedside on patient arrival. Labs drawn and patient cleared for CT by Dr. Criss Alvine. Patient to CT with team. NIHSS 1, see documentation for details and code stroke times. Patient with disoriented on exam. The following imaging was completed:  CT Head and CTA. Patient is a candidate for IV Thrombolytic see MAR. Patient is not a candidate for IR due to no LVO per MD Bhagat.   Care Plan: q15 NIHSS/VS x 2 hours, q30 NIHSS/VS x 6 hours, and Q1 NIHSS/VS x16 hours.  Notify provider of new onset of headache or worsening symptoms, signs of oral angioedema, or bleeding.    Bedside handoff with ED RN Duwayne Heck.    Lucila Maine  Stroke Response RN

## 2022-08-09 ENCOUNTER — Inpatient Hospital Stay (HOSPITAL_COMMUNITY): Payer: Managed Care, Other (non HMO)

## 2022-08-09 ENCOUNTER — Encounter (HOSPITAL_COMMUNITY): Payer: Self-pay | Admitting: Neurology

## 2022-08-09 DIAGNOSIS — I639 Cerebral infarction, unspecified: Secondary | ICD-10-CM | POA: Diagnosis not present

## 2022-08-09 LAB — GLUCOSE, CAPILLARY
Glucose-Capillary: 85 mg/dL (ref 70–99)
Glucose-Capillary: 96 mg/dL (ref 70–99)

## 2022-08-09 LAB — BASIC METABOLIC PANEL
Anion gap: 10 (ref 5–15)
BUN: 16 mg/dL (ref 6–20)
CO2: 26 mmol/L (ref 22–32)
Calcium: 9 mg/dL (ref 8.9–10.3)
Chloride: 103 mmol/L (ref 98–111)
Creatinine, Ser: 0.98 mg/dL (ref 0.61–1.24)
GFR, Estimated: >60 mL/min (ref >60)
Glucose, Bld: 92 mg/dL (ref 70–99)
Potassium: 4 mmol/L (ref 3.5–5.1)
Sodium: 139 mmol/L (ref 135–145)

## 2022-08-09 LAB — LIPID PANEL
Cholesterol: 137 mg/dL (ref 0–200)
HDL: 48 mg/dL (ref >40)
LDL Cholesterol: 62 mg/dL (ref 0–99)
Total CHOL/HDL Ratio: 2.9 RATIO
Triglycerides: 135 mg/dL (ref <150)
VLDL: 27 mg/dL (ref 0–40)

## 2022-08-09 LAB — CBC
HCT: 45.1 % (ref 39.0–52.0)
Hemoglobin: 15.2 g/dL (ref 13.0–17.0)
MCH: 31.4 pg (ref 26.0–34.0)
MCHC: 33.7 g/dL (ref 30.0–36.0)
MCV: 93.2 fL (ref 80.0–100.0)
Platelets: 216 10*3/uL (ref 150–400)
RBC: 4.84 MIL/uL (ref 4.22–5.81)
RDW: 12.5 % (ref 11.5–15.5)
WBC: 8.3 10*3/uL (ref 4.0–10.5)
nRBC: 0 % (ref 0.0–0.2)

## 2022-08-09 LAB — HEMOGLOBIN A1C
Hgb A1c MFr Bld: 5.8 % — ABNORMAL HIGH (ref 4.8–5.6)
Mean Plasma Glucose: 120 mg/dL

## 2022-08-09 LAB — HIV ANTIBODY (ROUTINE TESTING W REFLEX): HIV Screen 4th Generation wRfx: NONREACTIVE

## 2022-08-09 MED ORDER — ASPIRIN 81 MG PO TBEC
81.0000 mg | DELAYED_RELEASE_TABLET | Freq: Every day | ORAL | Status: DC
  Administered 2022-08-09 – 2022-08-10 (×2): 81 mg via ORAL
  Filled 2022-08-09 (×2): qty 1

## 2022-08-09 MED ORDER — ROSUVASTATIN CALCIUM 5 MG PO TABS
10.0000 mg | ORAL_TABLET | Freq: Every day | ORAL | Status: DC
  Administered 2022-08-09: 10 mg via ORAL
  Filled 2022-08-09: qty 2

## 2022-08-09 MED ORDER — ENOXAPARIN SODIUM 40 MG/0.4ML IJ SOSY
40.0000 mg | PREFILLED_SYRINGE | INTRAMUSCULAR | Status: DC
  Administered 2022-08-09: 40 mg via SUBCUTANEOUS
  Filled 2022-08-09: qty 0.4

## 2022-08-09 MED ORDER — CLOPIDOGREL BISULFATE 75 MG PO TABS
75.0000 mg | ORAL_TABLET | Freq: Every day | ORAL | Status: DC
  Administered 2022-08-09 – 2022-08-10 (×2): 75 mg via ORAL
  Filled 2022-08-09 (×2): qty 1

## 2022-08-09 MED ORDER — PANTOPRAZOLE SODIUM 40 MG PO TBEC
40.0000 mg | DELAYED_RELEASE_TABLET | Freq: Every day | ORAL | Status: DC
  Administered 2022-08-09 – 2022-08-10 (×2): 40 mg via ORAL
  Filled 2022-08-09: qty 1

## 2022-08-09 NOTE — Progress Notes (Signed)
PT Cancellation Note  Patient Details Name: Guy Thomas MRN: [id number] DOB: Aug 07, 1966   Cancelled Treatment:    Reason Eval/Treat Not Completed: Active bedrest order  Lillia Pauls, PT, DPT Acute Rehabilitation Services Office [phone]    Norval Morton 08/09/2022, 8:04 AM

## 2022-08-09 NOTE — Progress Notes (Signed)
STROKE TEAM PROGRESS NOTE   SUBJECTIVE (INTERVAL HISTORY) His RN is at the bedside.  Overall his condition is unchanged. Still feel dizzy and room spinning with motion and still has nystagmus. No more N/V, ate breakfast well. MRI and CT negative on repeat/   OBJECTIVE Temp:  [97.5 F (36.4 C)-98.6 F (37 C)] 98.3 F (36.8 C) (11/28 1102) Pulse Rate:  [48-69] 59 (11/28 1100) Cardiac Rhythm: Sinus bradycardia (11/28 1130) Resp:  [11-29] 14 (11/28 1100) BP: (93-141)/(51-87) 122/71 (11/28 1100) SpO2:  [92 %-97 %] 95 % (11/28 1100) Weight:  [79.3 kg-82.6 kg] 79.3 kg (11/28 0400)  Recent Labs  Lab 08/08/22 0954 08/08/22 2141 08/09/22 0932 08/09/22 1057  GLUCAP 131* 100* 85 96   Recent Labs  Lab 08/08/22 0950 08/08/22 0959 08/09/22 0324  NA 138 139 139  K 3.8 3.8 4.0  CL 106 104 103  CO2 25  --  26  GLUCOSE 141* 142* 92  BUN 20 23* 16  CREATININE 0.89 0.80 0.98  CALCIUM 8.9  --  9.0   Recent Labs  Lab 08/08/22 0950  AST 32  ALT 29  ALKPHOS 39  BILITOT 0.9  PROT 6.9  ALBUMIN 4.1   Recent Labs  Lab 08/08/22 0950 08/08/22 0959 08/09/22 0324  WBC 7.4  --  8.3  NEUTROABS 3.9  --   --   HGB 15.4 15.3 15.2  HCT 44.7 45.0 45.1  MCV 92.9  --  93.2  PLT 232  --  216   No results for input(s): "CKTOTAL", "CKMB", "CKMBINDEX", "TROPONINI" in the last 168 hours. Recent Labs    08/08/22 0950  LABPROT 12.6  INR 1.0   No results for input(s): "COLORURINE", "LABSPEC", "PHURINE", "GLUCOSEU", "HGBUR", "BILIRUBINUR", "KETONESUR", "PROTEINUR", "UROBILINOGEN", "NITRITE", "LEUKOCYTESUR" in the last 72 hours.  Invalid input(s): "APPERANCEUR"     Component Value Date/Time   CHOL 137 08/09/2022 0324   TRIG 135 08/09/2022 0324   HDL 48 08/09/2022 0324   CHOLHDL 2.9 08/09/2022 0324   VLDL 27 08/09/2022 0324   LDLCALC 62 08/09/2022 0324   Lab Results  Component Value Date   HGBA1C 5.8 (H) 08/08/2022      Component Value Date/Time   LABOPIA NONE DETECTED 03/19/2015  0820   COCAINSCRNUR NONE DETECTED 03/19/2015 0820   LABBENZ NONE DETECTED 03/19/2015 0820   AMPHETMU NONE DETECTED 03/19/2015 0820   THCU NONE DETECTED 03/19/2015 0820   LABBARB NONE DETECTED 03/19/2015 0820    Recent Labs  Lab 08/08/22 0950  ETH <10    I have personally reviewed the radiological images below and agree with the radiology interpretations.  CT HEAD WO CONTRAST ( )  Result Date: 08/09/2022 CLINICAL DATA:  Stroke follow-up EXAM: CT HEAD WITHOUT CONTRAST TECHNIQUE: Contiguous axial images were obtained from the base of the skull through the vertex without intravenous contrast. RADIATION DOSE REDUCTION: This exam was performed according to the departmental dose-optimization program which includes automated exposure control, adjustment of the mA and/or kV according to patient size and/or use of iterative reconstruction technique. COMPARISON:  CT head 08/08/2022.  MRI head 08/09/2022 FINDINGS: Brain: No evidence of acute infarction, hemorrhage, hydrocephalus, extra-axial collection or mass lesion/mass effect. Mild patchy white matter hypodensity similar to the prior studies Vascular: Negative for hyperdense vessel Skull: Negative Sinuses/Orbits: Paranasal sinuses clear.  Negative orbit Other: None IMPRESSION: No acute abnormality. Mild white matter changes likely due to chronic microvascular ischemia. Electronically Signed   By: Marlan Palau M.D.   On: 08/09/2022 16:10  MR BRAIN WO CONTRAST  Result Date: 08/09/2022 CLINICAL DATA:  Acute neurologic deficit EXAM: MRI HEAD WITHOUT CONTRAST TECHNIQUE: Multiplanar, multiecho pulse sequences of the brain and surrounding structures were obtained without intravenous contrast. COMPARISON:  03/19/2015 FINDINGS: Brain: No acute infarct, mass effect or extra-axial collection. No acute or chronic hemorrhage. There is multifocal hyperintense T2-weighted signal within the white matter. Parenchymal volume and CSF spaces are normal. The midline  structures are normal. Vascular: Major flow voids are preserved. Skull and upper cervical spine: Normal calvarium and skull base. Visualized upper cervical spine and soft tissues are normal. Sinuses/Orbits:No paranasal sinus fluid levels or advanced mucosal thickening. No mastoid or middle ear effusion. Normal orbits. IMPRESSION: 1. No acute intracranial abnormality. 2. Multifocal hyperintense T2-weighted signal within the white matter, nonspecific but most commonly seen in the setting of chronic small vessel ischemia. Electronically Signed   By: Deatra RobinsonKevin  Herman M.D.   On: 08/09/2022 01:18   ECHOCARDIOGRAM COMPLETE  Result Date: 08/08/2022    ECHOCARDIOGRAM REPORT   Patient Name:   Guy Thomas Date of Exam: 08/08/2022 Medical Rec #:  [id number]          Height:       71.0 in Accession #:    [id number]         Weight:       196.2 lb Date of Birth:  09/24/1965          BSA:          2.091 m Patient Age:    56 years           BP:           126/85 mmHg Patient Gender: M                  HR:           58 bpm. Exam Location:  Inpatient Procedure: 2D Echo, Cardiac Doppler and Color Doppler Indications:    CVA  History:        Patient has prior history of Echocardiogram examinations.  Sonographer:    NA Referring Phys: [id number] DEVON SHAFER IMPRESSIONS  1. Left ventricular ejection fraction, by estimation, is 50 to 55%. The left ventricle has low normal function. The left ventricle has no regional wall motion abnormalities. There is mild concentric left ventricular hypertrophy. Left ventricular diastolic parameters were normal.  2. Right ventricular systolic function is normal. The right ventricular size is normal. There is normal pulmonary artery systolic pressure. The estimated right ventricular systolic pressure is 29.2 mmHg.  3. Right atrial size was mildly dilated.  4. The mitral valve is normal in structure. Trivial mitral valve regurgitation. No evidence of mitral stenosis.  5. The aortic valve is tricuspid.  Aortic valve regurgitation is not visualized. No aortic stenosis is present.  6. The inferior vena cava is dilated in size with >50% respiratory variability, suggesting right atrial pressure of 8 mmHg. FINDINGS  Left Ventricle: Left ventricular ejection fraction, by estimation, is 50 to 55%. The left ventricle has low normal function. The left ventricle has no regional wall motion abnormalities. The left ventricular internal cavity size was normal in size. There is mild concentric left ventricular hypertrophy. Left ventricular diastolic parameters were normal. Right Ventricle: The right ventricular size is normal. No increase in right ventricular wall thickness. Right ventricular systolic function is normal. There is normal pulmonary artery systolic pressure. The tricuspid regurgitant velocity is 2.30 m/s, and  with an assumed right atrial pressure of  8 mmHg, the estimated right ventricular systolic pressure is 29.2 mmHg. Left Atrium: Left atrial size was normal in size. Right Atrium: Right atrial size was mildly dilated. Pericardium: There is no evidence of pericardial effusion. Mitral Valve: The mitral valve is normal in structure. Trivial mitral valve regurgitation. No evidence of mitral valve stenosis. MV peak gradient, 4.4 mmHg. The mean mitral valve gradient is 2.0 mmHg. Tricuspid Valve: The tricuspid valve is normal in structure. Tricuspid valve regurgitation is trivial. Aortic Valve: The aortic valve is tricuspid. Aortic valve regurgitation is not visualized. No aortic stenosis is present. Aortic valve mean gradient measures 4.0 mmHg. Aortic valve peak gradient measures 7.3 mmHg. Aortic valve area, by VTI measures 3.48 cm. Pulmonic Valve: The pulmonic valve was grossly normal. Pulmonic valve regurgitation is trivial. Aorta: The aortic root is normal in size and structure. Venous: The inferior vena cava is dilated in size with greater than 50% respiratory variability, suggesting right atrial pressure of 8  mmHg. IAS/Shunts: No atrial level shunt detected by color flow Doppler.  LEFT VENTRICLE PLAX 2D LVIDd:         5.40 cm   Diastology LVIDs:         3.55 cm   LV e' medial:    11.70 cm/s LV PW:         1.40 cm   LV E/e' medial:  7.0 LV IVS:        1.00 cm   LV e' lateral:   12.10 cm/s LVOT diam:     2.30 cm   LV E/e' lateral: 6.8 LV SV:         112 LV SV Index:   53 LVOT Area:     4.15 cm  RIGHT VENTRICLE          IVC RV Basal diam:  5.15 cm  IVC diam: 2.10 cm RV Mid diam:    3.20 cm LEFT ATRIUM             Index        RIGHT ATRIUM           Index LA diam:        3.50 cm 1.67 cm/m   RA Area:     30.10 cm LA Vol (A2C):   56.1 ml 26.82 ml/m  RA Volume:   124.00 ml 59.29 ml/m LA Vol (A4C):   46.9 ml 22.42 ml/m LA Biplane Vol: 52.0 ml 24.86 ml/m  AORTIC VALVE AV Area (Vmax):    3.45 cm AV Area (Vmean):   3.21 cm AV Area (VTI):     3.48 cm AV Vmax:           135.00 cm/s AV Vmean:          95.500 cm/s AV VTI:            0.321 m AV Peak Grad:      7.3 mmHg AV Mean Grad:      4.0 mmHg LVOT Vmax:         112.00 cm/s LVOT Vmean:        73.700 cm/s LVOT VTI:          0.269 m LVOT/AV VTI ratio: 0.84  AORTA Ao Root diam: 3.30 cm Ao Asc diam:  3.40 cm MITRAL VALVE               TRICUSPID VALVE MV Area (PHT): 3.77 cm    TR Peak grad:   21.2 mmHg MV Area VTI:  4.40 cm    TR Vmax:        230.00 cm/s MV Peak grad:  4.4 mmHg MV Mean grad:  2.0 mmHg    SHUNTS MV Vmax:       1.04 m/s    Systemic VTI:  0.27 m MV Vmean:      56.4 cm/s   Systemic Diam: 2.30 cm MV Decel Time: 201 msec MV E velocity: 82.30 cm/s MV A velocity: 61.70 cm/s MV E/A ratio:  1.33 Dalton McleanMD Electronically signed by Wilfred Lacy Signature Date/Time: 08/08/2022/2:26:52 PM    Final    CT ANGIO HEAD NECK W WO CM (CODE STROKE)  Result Date: 08/08/2022 CLINICAL DATA:  Neuro deficit, acute, stroke suspected. Acute blurred vision, dizziness, and vomiting. EXAM: CT ANGIOGRAPHY HEAD AND NECK TECHNIQUE: Multidetector CT imaging of the head and neck  was performed using the standard protocol during bolus administration of intravenous contrast. Multiplanar CT image reconstructions and MIPs were obtained to evaluate the vascular anatomy. Carotid stenosis measurements (when applicable) are obtained utilizing NASCET criteria, using the distal internal carotid diameter as the denominator. RADIATION DOSE REDUCTION: This exam was performed according to the departmental dose-optimization program which includes automated exposure control, adjustment of the mA and/or kV according to patient size and/or use of iterative reconstruction technique. CONTRAST:  75mL OMNIPAQUE IOHEXOL 350 MG/ML SOLN COMPARISON:  Head and neck CTA 03/19/2015 FINDINGS: CTA NECK FINDINGS Aortic arch: Standard 3 vessel aortic arch with mild atherosclerotic plaque. Widely patent arch vessel origins. Right carotid system: Patent without evidence of stenosis, dissection, or significant atherosclerosis. Left carotid system: Patent with a small amount of calcified plaque at the carotid bifurcation. No evidence of a significant stenosis or dissection. Vertebral arteries: Patent with the left being dominant. Increased calcified plaque at the right vertebral origin results in new mild stenosis. Skeleton: Advanced disc degeneration at C4-5. Asymmetrically severe left facet arthrosis at C3-4. Interbody and facet ankylosis at C2-3. Other neck: No evidence of cervical lymphadenopathy or mass. Upper chest: Clear lung apices. Review of the MIP images confirms the above findings CTA HEAD FINDINGS Anterior circulation: The internal carotid arteries are patent from skull base to carotid termini with minimal atherosclerotic plaque not resulting in significant stenosis. ACAs and MCAs are patent without evidence of a proximal branch occlusion or significant proximal stenosis. No aneurysm is identified. Posterior circulation: The intracranial vertebral arteries are patent to the basilar with the left being strongly  dominant. Patent PICA and SCA origins are seen bilaterally. The basilar artery is widely patent. Posterior communicating arteries are diminutive or absent. The PCAs are patent without evidence of a significant proximal stenosis. No aneurysm is identified. Venous sinuses: Patent. Anatomic variants: None. Review of the MIP images confirms the above findings These results were communicated to Dr. Iver Nestle at 10:19 am on 08/08/2022 by text page via the [facility] messaging system. IMPRESSION: 1. Mild atherosclerosis in the head and neck without a large vessel occlusion or significant proximal intracranial stenosis. 2. New, mild stenosis of the right vertebral artery origin. 3.  Aortic Atherosclerosis (ICD10-I70.0). Electronically Signed   By: Sebastian Ache M.D.   On: 08/08/2022 10:29   CT HEAD CODE STROKE WO CONTRAST  Result Date: 08/08/2022 CLINICAL DATA:  Code stroke.  56 year old male. EXAM: CT HEAD WITHOUT CONTRAST TECHNIQUE: Contiguous axial images were obtained from the base of the skull through the vertex without intravenous contrast. RADIATION DOSE REDUCTION: This exam was performed according to the departmental dose-optimization program which includes automated exposure  control, adjustment of the mA and/or kV according to patient size and/or use of iterative reconstruction technique. COMPARISON:  Brain MRI and head CT 03/19/2015. FINDINGS: Brain: Cerebral volume remains within normal limits for age. No midline shift, ventriculomegaly, mass effect, evidence of mass lesion, intracranial hemorrhage or evidence of cortically based acute infarction. Gray-white matter differentiation is within normal limits throughout the brain. Vascular: Mild Calcified atherosclerosis at the skull base. Suspicious asymmetric hyperdensity of the right MCA M1. Skull: Negative. Sinuses/Orbits: Visualized paranasal sinuses and mastoids are clear. Other: Visualized orbits and scalp soft tissues are within normal limits. ASPECTS [facility]  Stroke Program Early CT Score) Total score (0-10 with 10 being normal): 10 IMPRESSION: 1. Suspicious asymmetric hyperdensity of the right MCA M1, but Normal noncontrast CT appearance of the brain. ASPECTS 10. 2. These results were communicated to Dr. Iver Nestle at 10:06 am on 08/08/2022 by text page via the [facility] messaging system. Electronically Signed   By: Odessa Fleming M.D.   On: 08/08/2022 10:06     PHYSICAL EXAM  Temp:  [97.5 F (36.4 C)-98.6 F (37 C)] 98.3 F (36.8 C) (11/28 1102) Pulse Rate:  [48-69] 59 (11/28 1100) Resp:  [11-29] 14 (11/28 1100) BP: (93-141)/(51-87) 122/71 (11/28 1100) SpO2:  [92 %-97 %] 95 % (11/28 1100) Weight:  [79.3 kg-82.6 kg] 79.3 kg (11/28 0400)  General - Well nourished, well developed, in no apparent distress.  Ophthalmologic - fundi not visualized due to noncooperation.  Cardiovascular - Regular rhythm and rate.  Mental Status -  Level of arousal and orientation to time, place, and person were intact. Language including expression, naming, repetition, comprehension was assessed and found intact. Fund of Knowledge was assessed and was intact.  Cranial Nerves II - XII - II - Visual field intact OU. III, IV, VI - Extraocular movements intact. V - Facial sensation intact bilaterally. VII - Facial movement intact bilaterally. VIII - Hearing intact bilaterally. Left gaze nystagmus direction to left. Right gaze no nystagmus. Upper gaze nystagmus with direction to left. Downward gaze no nystagmus. Head pulse test negative. No skew with cover/uncover test.  X - Palate elevates symmetrically. XI - Chin turning & shoulder shrug intact bilaterally. XII - Tongue protrusion intact.  Motor Strength - The patient's strength was normal in all extremities and pronator drift was absent.  Bulk was normal and fasciculations were absent.   Motor Tone - Muscle tone was assessed at the neck and appendages and was normal.  Reflexes - The patient's reflexes were symmetrical in  all extremities and he had no pathological reflexes.  Sensory - Light touch, temperature/pinprick were assessed and were symmetrical.    Coordination - The patient had normal movements in the hands and feet with no ataxia or dysmetria.  Tremor was absent.  Gait and Station - deferred.   ASSESSMENT/PLAN Mr. SEIF TEICHERT is a 56 y.o. male with history of hyperlipidemia, anxiety, syncope admitted for dizziness, nausea vomiting x2 and visual disturbance.  TNK given.    Stroke: Could be DWI negative posterior stroke status post TNK, secondary to small vessel disease source CT no acute abnormality CTA head and neck unremarkable MRI no acute infarct 2D Echo EF 50 to 55% LDL 62 HgbA1c 5.8 Lovenox for VTE prophylaxis aspirin 325 mg daily prior to admission, now on aspirin 81 mg daily and clopidogrel 75 mg daily DAPT for 3 weeks and then Plavix alone Patient counseled to be compliant with his antithrombotic medications Ongoing aggressive stroke risk factor management Therapy recommendations:  Outpatient PT/OT Disposition: Pending  Less likely vestibular neuritis Differential diagnosis of patient's symptoms including vestibular neuritis  Supporting point including MRI negative, nystagmus in one direction, no skew with cover/uncover test However had a pulse test negative (although nystagmus somehow interferes to test a little bit), and close eyes feels better Will treat as DWI negative stroke at this time.  Hypertension Stable Long term BP goal normotensive  Hyperlipidemia Home meds:  crestor 10  LDL 62, goal < 70 Now on crestor 10 Continue statin at discharge  Other Stroke Risk Factors   Other Active Problems Anxiety  syncope  Hospital day # 1  This patient is critically ill due to stroke status post TNK and at significant risk of neurological worsening, death form recurrent stroke, hemorrhagic transformation, bleeding from TNK. This patient's care requires constant  monitoring of vital signs, hemodynamics, respiratory and cardiac monitoring, review of multiple databases, neurological assessment, discussion with family, other specialists and medical decision making of high complexity. I spent 35 minutes of neurocritical care time in the care of this patient.   Marvel Plan, MD PhD Stroke Neurology 08/09/2022 4:33 PM    To contact Stroke Continuity provider, please refer to [email or web address]. After hours, contact General Neurology

## 2022-08-09 NOTE — Progress Notes (Signed)
SLP Cancellation Note  Patient Details Name: Guy Thomas MRN: [id number] DOB: 05-Nov-1965   Cancelled treatment:       Reason Eval/Treat Not Completed: Patient at procedure or test/unavailable  [name] [name], MA CCC/SLP Clinical Specialist - Acute Care SLP Acute Rehabilitation Services Office number [phone]  [name] 08/09/2022, 3:49 PM

## 2022-08-09 NOTE — Evaluation (Signed)
Physical Therapy Evaluation Patient Details Name: Guy Thomas MRN: [id number] DOB: [birth year] Today's Date: 08/09/2022  History of Present Illness  Guy Thomas is a 56 y.o. male arriving to [place] as code stroke. Pt was at work  when he had sudden onset of double vision and dizziness along with two episodes of N/V. No significant PMH on file.  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 Pt presents with decreased functional mobility secondary to left visual field dizziness and resulting imbalance. Pt describes dizziness as constant in nature and is exacerbated with head turns and walking. Pt ambulating 175 ft with no assistive device at a min guard assist level. Instructed pt on gaze stabilization with mild improvement. Pt will benefit from OPPT to address deficits and maximize functional independence.      Recommendations for follow up therapy are one component of a multi-disciplinary discharge planning process, led by the attending physician.  Recommendations may be updated based on patient status, additional functional criteria and insurance authorization.  Follow Up Recommendations Outpatient PT (neuro)      Assistance Recommended at Discharge Frequent or constant Supervision/Assistance  Patient can return home with the following  A little help with walking and/or transfers;Assistance with cooking/housework;Assist for transportation;Help with stairs or ramp for entrance    Equipment Recommendations None recommended by PT  Recommendations for Other Services       Functional Status Assessment Patient has had a recent decline in their functional status and demonstrates the ability to make significant improvements in function in a reasonable and predictable amount of time.     Precautions / Restrictions Precautions Precautions: Fall Restrictions Weight Bearing Restrictions: No      Mobility  Bed Mobility                General bed mobility comments: OOB with OT    Transfers Overall transfer level: Needs assistance Equipment used: None Transfers: Sit to/from Stand Sit to Stand: Supervision                Ambulation/Gait Ambulation/Gait assistance: Min guard Gait Distance (Feet): 175 Feet Assistive device: None Gait Pattern/deviations: Drifts right/left       General Gait Details: Pt with mild left lateral drift, bumping into obstacle x 1 on left and with one lateral LOB requiring min guard assist to correct. Cues for gaze stabilization  Stairs            Wheelchair Mobility    Modified Rankin (Stroke Patients Only) Modified Rankin (Stroke Patients Only) Pre-Morbid Rankin Score: No symptoms Modified Rankin: Moderately severe disability     Balance Overall balance assessment: Needs assistance Sitting-balance support: Feet supported Sitting balance-Leahy Scale: Good     Standing balance support: No upper extremity supported, During functional activity Standing balance-Leahy Scale: Fair                               Pertinent Vitals/Pain Pain Assessment Pain Assessment: No/denies pain    Home Living Family/patient expects to be discharged to:: Private residence Living Arrangements: Spouse/significant other;Children Available Help at Discharge: Family;Available 24 hours/day Type of Home: House Home Access: Stairs to enter Entrance Stairs-Rails: None Entrance Stairs-Number of Steps: 4 Alternate Level Stairs-Number of Steps: flight Home Layout: Two level;Able to live on main level with bedroom/bathroom Home Equipment: Rolling Walker (2 wheels)      Prior Function Prior Level of  Function : Independent/Modified Independent;Working/employed;Driving             Mobility Comments: no AD. no falls ADLs Comments: works as a Scientist, research (life sciences): Right    Extremity/Trunk Assessment   Upper Extremity  Assessment Upper Extremity Assessment: Overall WFL for tasks assessed    Lower Extremity Assessment Lower Extremity Assessment: Overall WFL for tasks assessed    Cervical / Trunk Assessment Cervical / Trunk Assessment: Normal  Communication   Communication: No difficulties  Cognition Arousal/Alertness: Awake/alert Behavior During Therapy: WFL for tasks assessed/performed Overall Cognitive Status: Within Functional Limits for tasks assessed                                          General Comments General comments (skin integrity, edema, etc.): HR to 90s with mobility    Exercises     Assessment/Plan    PT Assessment Patient needs continued PT services  PT Problem List Decreased balance       PT Treatment Interventions Gait training;Stair training;Functional mobility training;Therapeutic activities;Therapeutic exercise;Balance training;Patient/family education    PT Goals (Current goals can be found in the Care Plan section)  Acute Rehab PT Goals Patient Stated Goal: less dizziness PT Goal Formulation: With patient Time For Goal Achievement: 08/23/22 Potential to Achieve Goals: Good    Frequency Min 4X/week     Co-evaluation               AM-PAC PT "6 Clicks" Mobility  Outcome Measure Help needed turning from your back to your side while in a flat bed without using bedrails?: None Help needed moving from lying on your back to sitting on the side of a flat bed without using bedrails?: None Help needed moving to and from a bed to a chair (including a wheelchair)?: A Little Help needed standing up from a chair using your arms (e.g., wheelchair or bedside chair)?: A Little Help needed to walk in hospital room?: A Little Help needed climbing 3-5 steps with a railing? : A Little 6 Click Score: 20    End of Session   Activity Tolerance: Patient tolerated treatment well Patient left: in chair;with call bell/phone within reach Nurse  Communication: Mobility status PT Visit Diagnosis: Unsteadiness on feet (R26.81)    Time: [id number] PT Time Calculation (min) (ACUTE ONLY): 17 min   Charges:   PT Evaluation $PT Eval Low Complexity: 1 Low          [name], PT, DPT Acute Rehabilitation Services Office [phone]   [name] 08/09/2022, 1:19 PM

## 2022-08-09 NOTE — Plan of Care (Signed)
  Problem: Education: Goal: Knowledge of disease or condition will improve Outcome: Progressing Goal: Knowledge of secondary prevention will improve (MUST DOCUMENT ALL) Outcome: Progressing Goal: Knowledge of patient specific risk factors will improve (Mark N/A or DELETE if not current risk factor) Outcome: Progressing   Problem: Ischemic Stroke/TIA Tissue Perfusion: Goal: Complications of ischemic stroke/TIA will be minimized Outcome: Progressing   Problem: Coping: Goal: Will verbalize positive feelings about self Outcome: Progressing Goal: Will identify appropriate support needs Outcome: Progressing   Problem: Health Behavior/Discharge Planning: Goal: Ability to manage health-related needs will improve Outcome: Progressing Goal: Goals will be collaboratively established with patient/family Outcome: Progressing   Problem: Self-Care: Goal: Ability to participate in self-care as condition permits will improve Outcome: Progressing Goal: Verbalization of feelings and concerns over difficulty with self-care will improve Outcome: Progressing Goal: Ability to communicate needs accurately will improve Outcome: Progressing   Problem: Nutrition: Goal: Risk of aspiration will decrease Outcome: Progressing Goal: Dietary intake will improve Outcome: Progressing   Problem: Education: Goal: Knowledge of General Education information will improve Description: Including pain rating scale, medication(s)/side effects and non-pharmacologic comfort measures Outcome: Progressing   Problem: Health Behavior/Discharge Planning: Goal: Ability to manage health-related needs will improve Outcome: Progressing   Problem: Clinical Measurements: Goal: Ability to maintain clinical measurements within normal limits will improve Outcome: Progressing Goal: Will remain free from infection Outcome: Progressing Goal: Diagnostic test results will improve Outcome: Progressing Goal: Respiratory  complications will improve Outcome: Progressing Goal: Cardiovascular complication will be avoided Outcome: Progressing   Problem: Activity: Goal: Risk for activity intolerance will decrease Outcome: Progressing   Problem: Nutrition: Goal: Adequate nutrition will be maintained Outcome: Progressing   Problem: Coping: Goal: Level of anxiety will decrease Outcome: Progressing   Problem: Elimination: Goal: Will not experience complications related to bowel motility Outcome: Progressing Goal: Will not experience complications related to urinary retention Outcome: Progressing   Problem: Pain Managment: Goal: General experience of comfort will improve Outcome: Progressing   Problem: Safety: Goal: Ability to remain free from injury will improve Outcome: Progressing   Problem: Skin Integrity: Goal: Risk for impaired skin integrity will decrease Outcome: Progressing   Problem: Education: Goal: Ability to describe self-care measures that may prevent or decrease complications (Diabetes Survival Skills Education) will improve Outcome: Progressing Goal: Individualized Educational Video(s) Outcome: Progressing   Problem: Coping: Goal: Ability to adjust to condition or change in health will improve Outcome: Progressing   Problem: Fluid Volume: Goal: Ability to maintain a balanced intake and output will improve Outcome: Progressing   Problem: Health Behavior/Discharge Planning: Goal: Ability to identify and utilize available resources and services will improve Outcome: Progressing Goal: Ability to manage health-related needs will improve Outcome: Progressing   Problem: Metabolic: Goal: Ability to maintain appropriate glucose levels will improve Outcome: Progressing   Problem: Nutritional: Goal: Maintenance of adequate nutrition will improve Outcome: Progressing Goal: Progress toward achieving an optimal weight will improve Outcome: Progressing   Problem: Skin  Integrity: Goal: Risk for impaired skin integrity will decrease Outcome: Progressing   Problem: Tissue Perfusion: Goal: Adequacy of tissue perfusion will improve Outcome: Progressing   

## 2022-08-09 NOTE — Evaluation (Signed)
Occupational Therapy Evaluation Patient Details Name: Guy Thomas MRN: [id number] DOB: 1965/12/29 Today's Date: 08/09/2022   History of Present Illness Guy Thomas is a 56 y.o. male arriving to [place] as code stroke. Pt was at work  when he had sudden onset of double vision and dizziness along with two episodes of N/V. No significant PMH on file.   Clinical Impression   Guy Thomas was evaluated s/p the above admission list, he is indep at baseline and lives with his wife and 84 yo son. Upon evaluation pt now has functional limitations due to impaired balance and L visual field dizziness. Overall he was mod I for bed mobility and min G for all transfers and mobility. Now noted to have mild LOBs, especially when floor color/texture changes as well as bumping into objects on L side. Pt reported mild improvement in L visual field deficits with L eye occluded. Will continue to assess. Due to deficits listed below, he also required up to min G for ADLs.    Recommendations for follow up therapy are one component of a multi-disciplinary discharge planning process, led by the attending physician.  Recommendations may be updated based on patient status, additional functional criteria and insurance authorization.   Follow Up Recommendations  Outpatient OT (neuro)     Assistance Recommended at Discharge Intermittent Supervision/Assistance  Patient can return home with the following A little help with walking and/or transfers;A little help with bathing/dressing/bathroom;Assist for transportation    Functional Status Assessment  Patient has had a recent decline in their functional status and demonstrates the ability to make significant improvements in function in a reasonable and predictable amount of time.  Equipment Recommendations  None recommended by OT       Precautions / Restrictions Precautions Precautions: Fall Restrictions Weight Bearing Restrictions: No      Mobility Bed  Mobility Overal bed mobility: Needs Assistance Bed Mobility: Supine to Sit     Supine to sit: Modified independent (Device/Increase time)          Transfers Overall transfer level: Needs assistance Equipment used: None Transfers: Sit to/from Stand Sit to Stand: Min guard           General transfer comment: initially for safety      Balance Overall balance assessment: Needs assistance Sitting-balance support: Feet supported Sitting balance-Leahy Scale: Good     Standing balance support: No upper extremity supported, During functional activity Standing balance-Leahy Scale: Fair                             ADL either performed or assessed with clinical judgement   ADL Overall ADL's : Needs assistance/impaired Eating/Feeding: Independent;Sitting   Grooming: Min guard;Standing   Upper Body Bathing: Set up;Sitting   Lower Body Bathing: Min guard;Sit to/from stand   Upper Body Dressing : Set up;Sitting   Lower Body Dressing: Min guard;Sit to/from stand   Toilet Transfer: Min guard;Ambulation   Toileting- Clothing Manipulation and Hygiene: Supervision/safety;Sitting/lateral lean       Functional mobility during ADLs: Min guard General ADL Comments: no AD. min G for safety in standing     Vision Baseline Vision/History: 1 Wears glasses Ability to See in Adequate Light: 1 Impaired Patient Visual Report: Other (comment) (dizziness in L field with L head turn) Vision Assessment?: Yes Eye Alignment: Within Functional Limits Ocular Range of Motion: Within Functional Limits Alignment/Gaze Preference: Within Defined Limits Tracking/Visual Pursuits: Able to track stimulus  in all quads without difficulty Saccades: Within functional limits Convergence: Within functional limits Visual Fields: No apparent deficits Depth Perception: Undershoots;Overshoots Additional Comments: vision impaired (pt describes as objects moving) in L environment with L head  turn only. Mildly improves with L eye occluded     Perception Perception Perception Tested?: No   Praxis Praxis Praxis tested?: Not tested    Pertinent Vitals/Pain Pain Assessment Pain Assessment: No/denies pain     Hand Dominance Right   Extremity/Trunk Assessment Upper Extremity Assessment Upper Extremity Assessment: Overall WFL for tasks assessed   Lower Extremity Assessment Lower Extremity Assessment: Defer to PT evaluation   Cervical / Trunk Assessment Cervical / Trunk Assessment: Normal   Communication Communication Communication: No difficulties   Cognition Arousal/Alertness: Awake/alert Behavior During Therapy: WFL for tasks assessed/performed Overall Cognitive Status: Within Functional Limits for tasks assessed                                       General Comments  HR to 90s with mobility            Home Living Family/patient expects to be discharged to:: Private residence Living Arrangements: Spouse/significant other;Children Available Help at Discharge: Family;Available 24 hours/day Type of Home: House Home Access: Stairs to enter [employer] of Steps: 4 Entrance Stairs-Rails: None Home Layout: Two level;Able to live on main level with bedroom/bathroom Alternate Level Stairs-Number of Steps: flight   Bathroom Shower/Tub: Producer, television/film/video: Standard     Home Equipment: Agricultural consultant (2 wheels)          Prior Functioning/Environment Prior Level of Function : Independent/Modified Independent;Working/employed;Driving             Mobility Comments: no AD. 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 vision/perception      OT Treatment/Interventions: Environmental manager;Therapeutic exercise;Visual/perceptual remediation/compensation    OT Goals(Current goals can be found in the care plan section) Acute Rehab OT Goals Patient Stated Goal: to get  better OT Goal Formulation: With patient Time For Goal Achievement: 08/23/22 Potential to Achieve Goals: Good ADL Goals Additional ADL Goal #1: Pt will complete BADLs independently Additional ADL Goal #2: Pt will demonstrate indep ability to safely navigate L environment using vision strategies  OT Frequency: Min 2X/week       AM-PAC OT "6 Clicks" Daily Activity     Outcome Measure Help from another person eating meals?: None Help from another person taking care of personal grooming?: A Little Help from another person toileting, which includes using toliet, bedpan, or urinal?: A Little Help from another person bathing (including washing, rinsing, drying)?: A Little Help from another person to put on and taking off regular upper body clothing?: None Help from another person to put on and taking off regular lower body clothing?: A Little 6 Click Score: 20   End of Session Nurse Communication: Mobility status  Activity Tolerance: Patient tolerated treatment well Patient left: Other (comment) (direct hand off to PT)  OT Visit Diagnosis: Unsteadiness on feet (R26.81);Dizziness and giddiness (R42)                Time: 1610-9604 OT Time Calculation (min): 10 min Charges:  OT Evaluation $OT Eval Moderate Complexity: 1 Mod    Jahziah Simonin D Causey 08/09/2022, 12:29 PM

## 2022-08-10 ENCOUNTER — Other Ambulatory Visit (HOSPITAL_COMMUNITY): Payer: Self-pay

## 2022-08-10 DIAGNOSIS — R42 Dizziness and giddiness: Secondary | ICD-10-CM

## 2022-08-10 DIAGNOSIS — I639 Cerebral infarction, unspecified: Secondary | ICD-10-CM | POA: Diagnosis not present

## 2022-08-10 LAB — BASIC METABOLIC PANEL
Anion gap: 9 (ref 5–15)
BUN: 15 mg/dL (ref 6–20)
CO2: 29 mmol/L (ref 22–32)
Calcium: 9.1 mg/dL (ref 8.9–10.3)
Chloride: 102 mmol/L (ref 98–111)
Creatinine, Ser: 1.02 mg/dL (ref 0.61–1.24)
GFR, Estimated: >60 mL/min (ref >60)
Glucose, Bld: 95 mg/dL (ref 70–99)
Potassium: 4.1 mmol/L (ref 3.5–5.1)
Sodium: 140 mmol/L (ref 135–145)

## 2022-08-10 LAB — CBC
HCT: 46.2 % (ref 39.0–52.0)
Hemoglobin: 15.4 g/dL (ref 13.0–17.0)
MCH: 31.4 pg (ref 26.0–34.0)
MCHC: 33.3 g/dL (ref 30.0–36.0)
MCV: 94.1 fL (ref 80.0–100.0)
Platelets: 226 10*3/uL (ref 150–400)
RBC: 4.91 MIL/uL (ref 4.22–5.81)
RDW: 12.6 % (ref 11.5–15.5)
WBC: 7.3 10*3/uL (ref 4.0–10.5)
nRBC: 0 % (ref 0.0–0.2)

## 2022-08-10 MED ORDER — ASPIRIN 81 MG PO TBEC
81.0000 mg | DELAYED_RELEASE_TABLET | Freq: Every day | ORAL | 12 refills | Status: AC
  Filled 2022-08-10: qty 30, 30d supply, fill #0

## 2022-08-10 MED ORDER — MECLIZINE HCL 25 MG PO TABS: 25.0000 mg | ORAL_TABLET | Freq: Three times a day (TID) | ORAL | Status: DC | PRN

## 2022-08-10 MED ORDER — MECLIZINE HCL 25 MG PO TABS
25.0000 mg | ORAL_TABLET | Freq: Three times a day (TID) | ORAL | 0 refills | Status: DC | PRN
  Filled 2022-08-10: qty 30, 10d supply, fill #0

## 2022-08-10 MED ORDER — CLOPIDOGREL BISULFATE 75 MG PO TABS
75.0000 mg | ORAL_TABLET | Freq: Every day | ORAL | 0 refills | Status: AC
  Filled 2022-08-10: qty 20, 20d supply, fill #0

## 2022-08-10 NOTE — TOC Initial Note (Signed)
Transition of Care [facility]) - Initial/Assessment Note    Patient Details  Name: Guy Thomas MRN: [id number] Date of Birth: 1966/02/10  [facility]) CM/SW Contact:    Harriet Masson, RN Phone Number: 08/10/2022, 12:21 PM  Clinical Narrative:                 Spoke to patient regarding OP rehab. Patient chose [facility] rehab services.  [facility]ral faxed.  Patient states his wife can transport him. No other TOC needs at this time.   Expected Discharge Plan: OP Rehab Barriers to Discharge: Continued Medical Work up   Patient Goals and CMS Choice Patient states their goals for this hospitalization and ongoing recovery are:: return home CMS Medicare.gov Compare Post Acute Care list provided to:: Patient Choice offered to / list presented to : Patient  Expected Discharge Plan and Services Expected Discharge Plan: OP Rehab   Discharge Planning Services: CM Consult   Living arrangements for the past 2 months: Single Family Home                                      Prior Living Arrangements/Services Living arrangements for the past 2 months: Single Family Home Lives with:: Spouse Patient language and need for interpreter reviewed:: Yes Do you feel safe going back to the place where you live?: Yes      Need for Family Participation in Patient Care: Yes (Comment) Care giver support system in place?: Yes (comment)   Criminal Activity/Legal Involvement Pertinent to Current Situation/Hospitalization: No - Comment as needed  Activities of Daily Living Home Assistive Devices/Equipment: None ADL Screening (condition at time of admission) Patient's cognitive ability adequate to safely complete daily activities?: Yes Is the patient deaf or have difficulty hearing?: No Does the patient have difficulty seeing, even when wearing glasses/contacts?: Yes Does the patient have difficulty concentrating, remembering, or making decisions?: No Patient able to  express need for assistance with ADLs?: No Does the patient have difficulty dressing or bathing?: No Independently performs ADLs?: Yes (appropriate for developmental age) Does the patient have difficulty walking or climbing stairs?: No Weakness of Legs: None Weakness of Arms/Hands: None  Permission Sought/Granted Permission sought to share information with : Facility Engineer, maintenance (IT) granted to share info w AGENCY: OP rehab        Emotional Assessment Appearance:: Appears stated age Attitude/Demeanor/Rapport: Engaged Affect (typically observed): Accepting Orientation: : Oriented to Self, Oriented to Place, Oriented to  Time, Oriented to Situation Alcohol / Substance Use: Not Applicable Psych Involvement: No (comment)  Admission diagnosis:  Dizziness [R42] Stroke determined by clinical assessment [facility]) [I63.9] Patient Active Problem List   Diagnosis Date Noted   Stroke determined by clinical assessment (HCC) 08/08/2022   Degenerative arthritis of cervical spine 03/20/2015   TIA (transient ischemic attack) 03/19/2015   Dyslipidemia 03/19/2015   Low testosterone 03/19/2015   Right shoulder pain 03/19/2015   Seizures (HCC)    PCP:  [name], MD Pharmacy:   [facility] [phone] [place], [place] - [address] AT [facility] [address] [place] [address] Phone: [phone] Fax: [phone]  [facility] [facility] [address] - [place], [place] - [address] [facility] [address] [facility] [place] [address] Phone: [phone] Fax: [phone]  [facility] #[id number] [place], [place] - [address] AT [facility] OF [address] & [facility] [address] [place] [address] Phone: [phone] Fax: [phone]     Social Determinants of Health (SDOH) Interventions    Readmission Risk Interventions     No data to display

## 2022-08-10 NOTE — Discharge Instructions (Signed)
You were admitted for dizziness and nausea vomiting. There was concern of stroke we gave you the clot buster medication. Your MRI brain showed no stroke. However, very small stroke may not show up on MRI. Your exam indicate your symptoms could be small stroke or could be brain nerve infection/inflammation (vestibular neuritis), we are not 100% sure which it was this time. We would like to give you the medication for stroke prevention.  - continue aspirin 81 and  plavix 75 for 3 weeks and then aspirin along after -continue your crestor at home -you need you wife supervision at home for several days to make sure you are doing OK -we recommend off work for 2 weeks until you recover from the symptoms.  -any stroke like symptoms call 911 or come to nearest emergency room right away.  -follow up with family doctor and neurology, we will set up the appointment -you still need some outpatient therapy, please call the number we give to you to make appointment ASAP.

## 2022-08-10 NOTE — Progress Notes (Signed)
Physical Therapy Treatment Patient Details Name: Guy Thomas MRN: [id number] DOB: May 07, 1966 Today's Date: 08/10/2022   History of Present Illness Guy Thomas is a 56 y.o. male arriving to [place] as code stroke. Pt was at work  when he had sudden onset of double vision and dizziness along with two episodes of N/V. No significant PMH on file.    PT Comments    Patient reports some improvement in dizziness at rest and with mobility. He was able to complete bed mobility at Mod Ind level and sit<>stands with supervision only. Pt requires min guard for safety with gait as he tends to drift Lt slightly. Pt reports he had the sensation of falling to the left at onset of dizziness and still feels he is drifting slight left. Vestibular testing completed and Lt beating nystagmus noted in Lt gaze only, Head impulse test positive on the Rt, and Lt nystagmus with head shaking. Test of Skew was negative as well. Discussed findings with MD. Continue to recommend OPPT for neuro/vestibular rehab. Will continue to progress as able.   Recommendations for follow up therapy are one component of a multi-disciplinary discharge planning process, led by the attending physician.  Recommendations may be updated based on patient status, additional functional criteria and insurance authorization.  Follow Up Recommendations  Outpatient PT (neuro)     Assistance Recommended at Discharge Frequent or constant Supervision/Assistance  Patient can return home with the following A little help with walking and/or transfers;Assistance with cooking/housework;Assist for transportation;Help with stairs or ramp for entrance   Equipment Recommendations  None recommended by PT    Recommendations for Other Services       Precautions / Restrictions Precautions Precautions: Fall Restrictions Weight Bearing Restrictions: No      Vestibular Assessment - 08/10/22 0001       Symptom Behavior   Type of Dizziness   "World moves";Spinning;Imbalance    Symptom Nature Motion provoked;Constant    Aggravating Factors Turning head quickly;Turning head sideways;Activity in general;Moving eyes    Relieving Factors Lying supine;Dark room;Closing eyes;Slow movements    Progression of Symptoms Better    History of similar episodes no prior episodes      Oculomotor Exam   Oculomotor Alignment Normal    Ocular ROM WFL    Spontaneous Absent    Gaze-induced  Left beating nystagmus with L gaze    Head shaking Horizontal L beating nystagmus    Head Shaking Vertical L beating nystagmus    Smooth Pursuits Saccades    Saccades Undershoots   1 corrective saccade   Comment normal Skew test      Oculomotor Exam-Fixation Suppressed    Left Head Impulse intact    Right Head Impulse impaired - pt unable to maintain gaze with turns to Rt and required corrective saccade to fix gaze with quick impulse. Lt nystagmus noted at end once gaze fixed.             Mobility  Bed Mobility Overal bed mobility: Modified Independent Bed Mobility: Supine to Sit, Sit to Supine     Supine to sit: Modified independent (Device/Increase time) Sit to supine: Modified independent (Device/Increase time)   General bed mobility comments: no assist needed, extra time    Transfers Overall transfer level: Needs assistance Equipment used: None Transfers: Sit to/from Stand Sit to Stand: Supervision           General transfer comment: sup for safety, no assist or cues needed    Ambulation/Gait  Ambulation/Gait assistance: Min guard Gait Distance (Feet): 200 Feet Assistive device: None Gait Pattern/deviations: Drifts right/left Gait velocity: fair     General Gait Details: slight Lt drift, pt able to scan environment, min guard for safety with cues to stabilize gaze to improve balance.   Stairs             Wheelchair Mobility    Modified Rankin (Stroke Patients Only) Modified Rankin (Stroke Patients  Only) Pre-Morbid Rankin Score: No symptoms Modified Rankin: Moderately severe disability     Balance Overall balance assessment: Needs assistance Sitting-balance support: Feet supported Sitting balance-Leahy Scale: Good     Standing balance support: No upper extremity supported, During functional activity Standing balance-Leahy Scale: Fair                              Cognition Arousal/Alertness: Awake/alert Behavior During Therapy: WFL for tasks assessed/performed Overall Cognitive Status: Within Functional Limits for tasks assessed                                          Exercises      General Comments        Pertinent Vitals/Pain Pain Assessment Pain Assessment: No/denies pain    Home Living                          Prior Function            PT Goals (current goals can now be found in the care plan section) Acute Rehab PT Goals Patient Stated Goal: less dizziness PT Goal Formulation: With patient Time For Goal Achievement: 08/23/22 Potential to Achieve Goals: Good Progress towards PT goals: Progressing toward goals    Frequency    Min 4X/week      PT Plan Current plan remains appropriate    Co-evaluation              AM-PAC PT "6 Clicks" Mobility   Outcome Measure  Help needed turning from your back to your side while in a flat bed without using bedrails?: None Help needed moving from lying on your back to sitting on the side of a flat bed without using bedrails?: None Help needed moving to and from a bed to a chair (including a wheelchair)?: A Little Help needed standing up from a chair using your arms (e.g., wheelchair or bedside chair)?: A Little Help needed to walk in hospital room?: A Little Help needed climbing 3-5 steps with a railing? : A Little 6 Click Score: 20    End of Session Equipment Utilized During Treatment: Gait belt Activity Tolerance: Patient tolerated treatment well Patient  left: in chair;with call bell/phone within reach Nurse Communication: Mobility status PT Visit Diagnosis: Unsteadiness on feet (R26.81)     Time: 0867-6195 PT Time Calculation (min) (ACUTE ONLY): 29 min  Charges:  $Gait Training: 8-22 mins $Physical Performance Test: 8-22 mins                     Wynn Maudlin, DPT Acute Rehabilitation Services Office [phone]  08/10/22 1:47 PM

## 2022-08-10 NOTE — Evaluation (Signed)
SLP Cancellation Note  Patient Details Name: Guy Thomas MRN: [id number] DOB: 23-Jul-1966   Cancelled treatment:       Reason Eval/Treat Not Completed: Other (comment);SLP screened, no needs identified, will sign off (no dysarthria, language or overt cog deficits observed and his MRI was negative, SLP screened pt)  Rolena Infante, MS [facility] SLP Acute Rehab Services Office [phone] Pager [phone]  Chales Abrahams 08/10/2022, 10:34 AM

## 2022-08-10 NOTE — Progress Notes (Signed)
  Transition of Care [facility]) Screening Note   Patient Details  Name: COLA HIGHFILL Date of Birth: Dec 19, 1965   Transition of Care [facility]) CM/SW Contact:    Harriet Masson, RN Phone Number: 08/10/2022, 8:11 AM    Transition of Care Department [facility]) has reviewed patient and no TOC needs have been identified at this time. We will continue to monitor patient advancement through interdisciplinary progression rounds. If new patient transition needs arise, please place a TOC consult.

## 2022-08-10 NOTE — Plan of Care (Signed)
  Problem: Education: Goal: Knowledge of disease or condition will improve Outcome: Progressing   Problem: Health Behavior/Discharge Planning: Goal: Ability to manage health-related needs will improve Outcome: Progressing   

## 2022-08-10 NOTE — Discharge Summary (Addendum)
Stroke Discharge Summary  Patient ID: Guy Thomas   MRN: [id number]      DOB: 1966/02/03  Date of Admission: 08/08/2022 Date of Discharge: 08/10/2022  Attending Physician:  Stroke, Md, MD, Stroke MD Consultant(s):    None  Patient's PCP:  [name], MD  DISCHARGE DIAGNOSIS:   Principal Problem:   DWI negative posterior infarct vs. Vestibular neuritis  Other problem: HTN HLD anxiety   Allergies as of 08/10/2022       Reactions   Penicillins Hives        Medication List     STOP taking these medications    ibuprofen 200 MG tablet Commonly known as: ADVIL       TAKE these medications    acetaminophen 500 MG tablet Commonly known as: TYLENOL Take 1,000 mg by mouth every Monday, Tuesday, Wednesday, Thursday, and Friday. (1100)   ALPRAZolam 0.5 MG tablet Commonly known as: XANAX Take 0.25-0.5 mg by mouth at bedtime as needed for sleep.   AndroGel Pump 20.25 MG/ACT (1.62%) Gel Generic drug: Testosterone Apply 4 Pump topically at bedtime.   aspirin EC 81 MG tablet Take 1 tablet (81 mg total) by mouth daily. Swallow whole. Start taking on: August 11, 2022 What changed:  medication strength how much to take when to take this additional instructions   clopidogrel 75 MG tablet Commonly known as: PLAVIX Take 1 tablet (75 mg total) by mouth daily for 20 days. Start taking on: August 11, 2022   Crestor 10 MG tablet Generic drug: rosuvastatin Take 10 mg by mouth at bedtime.   meclizine 25 MG tablet Commonly known as: ANTIVERT Take 1 tablet (25 mg total) by mouth 3 (three) times daily as needed for dizziness.   meloxicam 15 MG tablet Commonly known as: MOBIC Take 30 mg by mouth daily as needed for pain.   multivitamin tablet Take 1 tablet by mouth every Monday, Tuesday, Wednesday, Thursday, and Friday. (1100)        LABORATORY STUDIES CBC    Component Value Date/Time   WBC 7.3 08/10/2022 0825   RBC 4.91 08/10/2022 0825    HGB 15.4 08/10/2022 0825   HCT 46.2 08/10/2022 0825   PLT 226 08/10/2022 0825   MCV 94.1 08/10/2022 0825   MCH 31.4 08/10/2022 0825   MCHC 33.3 08/10/2022 0825   RDW 12.6 08/10/2022 0825   LYMPHSABS 2.7 08/08/2022 0950   MONOABS 0.7 08/08/2022 0950   EOSABS 0.1 08/08/2022 0950   BASOSABS 0.0 08/08/2022 0950   CMP    Component Value Date/Time   NA 140 08/10/2022 0825   K 4.1 08/10/2022 0825   CL 102 08/10/2022 0825   CO2 29 08/10/2022 0825   GLUCOSE 95 08/10/2022 0825   BUN 15 08/10/2022 0825   CREATININE 1.02 08/10/2022 0825   CALCIUM 9.1 08/10/2022 0825   PROT 6.9 08/08/2022 0950   ALBUMIN 4.1 08/08/2022 0950   AST 32 08/08/2022 0950   ALT 29 08/08/2022 0950   ALKPHOS 39 08/08/2022 0950   BILITOT 0.9 08/08/2022 0950   GFRNONAA >60 08/10/2022 0825   GFRAA >60 03/19/2015 0728   COAGS Lab Results  Component Value Date   INR 1.0 08/08/2022   INR 1.01 03/19/2015   Lipid Panel    Component Value Date/Time   CHOL 137 08/09/2022 0324   TRIG 135 08/09/2022 0324   HDL 48 08/09/2022 0324   CHOLHDL 2.9 08/09/2022 0324   VLDL 27 08/09/2022 0324   LDLCALC  62 08/09/2022 0324   HgbA1C  Lab Results  Component Value Date   HGBA1C 5.8 (H) 08/08/2022   Urinalysis    Component Value Date/Time   COLORURINE YELLOW 03/19/2015 0834   APPEARANCEUR CLEAR 03/19/2015 0834   LABSPEC 1.016 03/19/2015 0834   PHURINE 5.5 03/19/2015 0834   GLUCOSEU NEGATIVE 03/19/2015 0834   HGBUR NEGATIVE 03/19/2015 0834   BILIRUBINUR NEGATIVE 03/19/2015 0834   KETONESUR NEGATIVE 03/19/2015 0834   PROTEINUR NEGATIVE 03/19/2015 0834   UROBILINOGEN 0.2 03/19/2015 0834   NITRITE NEGATIVE 03/19/2015 0834   LEUKOCYTESUR NEGATIVE 03/19/2015 0834   Urine Drug Screen     Component Value Date/Time   LABOPIA NONE DETECTED 03/19/2015 0820   COCAINSCRNUR NONE DETECTED 03/19/2015 0820   LABBENZ NONE DETECTED 03/19/2015 0820   AMPHETMU NONE DETECTED 03/19/2015 0820   THCU NONE DETECTED 03/19/2015  0820   LABBARB NONE DETECTED 03/19/2015 0820    Alcohol Level    Component Value Date/Time   ETH <10 08/08/2022 0950     SIGNIFICANT DIAGNOSTIC STUDIES CT HEAD WO CONTRAST ( )  Result Date: 08/09/2022 CLINICAL DATA:  Stroke follow-up EXAM: CT HEAD WITHOUT CONTRAST TECHNIQUE: Contiguous axial images were obtained from the base of the skull through the vertex without intravenous contrast. RADIATION DOSE REDUCTION: This exam was performed according to the departmental dose-optimization program which includes automated exposure control, adjustment of the mA and/or kV according to patient size and/or use of iterative reconstruction technique. COMPARISON:  CT head 08/08/2022.  MRI head 08/09/2022 FINDINGS: Brain: No evidence of acute infarction, hemorrhage, hydrocephalus, extra-axial collection or mass lesion/mass effect. Mild patchy white matter hypodensity similar to the prior studies Vascular: Negative for hyperdense vessel Skull: Negative Sinuses/Orbits: Paranasal sinuses clear.  Negative orbit Other: None IMPRESSION: No acute abnormality. Mild white matter changes likely due to chronic microvascular ischemia. Electronically Signed   By: Marlan Palau M.D.   On: 08/09/2022 16:10   MR BRAIN WO CONTRAST  Result Date: 08/09/2022 CLINICAL DATA:  Acute neurologic deficit EXAM: MRI HEAD WITHOUT CONTRAST TECHNIQUE: Multiplanar, multiecho pulse sequences of the brain and surrounding structures were obtained without intravenous contrast. COMPARISON:  03/19/2015 FINDINGS: Brain: No acute infarct, mass effect or extra-axial collection. No acute or chronic hemorrhage. There is multifocal hyperintense T2-weighted signal within the white matter. Parenchymal volume and CSF spaces are normal. The midline structures are normal. Vascular: Major flow voids are preserved. Skull and upper cervical spine: Normal calvarium and skull base. Visualized upper cervical spine and soft tissues are normal. Sinuses/Orbits:No  paranasal sinus fluid levels or advanced mucosal thickening. No mastoid or middle ear effusion. Normal orbits. IMPRESSION: 1. No acute intracranial abnormality. 2. Multifocal hyperintense T2-weighted signal within the white matter, nonspecific but most commonly seen in the setting of chronic small vessel ischemia. Electronically Signed   By: Deatra Robinson M.D.   On: 08/09/2022 01:18   ECHOCARDIOGRAM COMPLETE  Result Date: 08/08/2022    ECHOCARDIOGRAM REPORT   Patient Name:   Guy Thomas Dawson Date of Exam: 08/08/2022 Medical Rec #:  [id number]          Height:       71.0 in Accession #:    [id number]         Weight:       196.2 lb Date of Birth:  19-Nov-1965          BSA:          2.091 m Patient Age:    7 years  BP:           126/85 mmHg Patient Gender: M                  HR:           58 bpm. Exam Location:  Inpatient Procedure: 2D Echo, Cardiac Doppler and Color Doppler Indications:    CVA  History:        Patient has prior history of Echocardiogram examinations.  Sonographer:    NA Referring Phys: [id number] DEVON SHAFER IMPRESSIONS  1. Left ventricular ejection fraction, by estimation, is 50 to 55%. The left ventricle has low normal function. The left ventricle has no regional wall motion abnormalities. There is mild concentric left ventricular hypertrophy. Left ventricular diastolic parameters were normal.  2. Right ventricular systolic function is normal. The right ventricular size is normal. There is normal pulmonary artery systolic pressure. The estimated right ventricular systolic pressure is 29.2 mmHg.  3. Right atrial size was mildly dilated.  4. The mitral valve is normal in structure. Trivial mitral valve regurgitation. No evidence of mitral stenosis.  5. The aortic valve is tricuspid. Aortic valve regurgitation is not visualized. No aortic stenosis is present.  6. The inferior vena cava is dilated in size with >50% respiratory variability, suggesting right atrial pressure of 8 mmHg.  FINDINGS  Left Ventricle: Left ventricular ejection fraction, by estimation, is 50 to 55%. The left ventricle has low normal function. The left ventricle has no regional wall motion abnormalities. The left ventricular internal cavity size was normal in size. There is mild concentric left ventricular hypertrophy. Left ventricular diastolic parameters were normal. Right Ventricle: The right ventricular size is normal. No increase in right ventricular wall thickness. Right ventricular systolic function is normal. There is normal pulmonary artery systolic pressure. The tricuspid regurgitant velocity is 2.30 m/s, and  with an assumed right atrial pressure of 8 mmHg, the estimated right ventricular systolic pressure is 29.2 mmHg. Left Atrium: Left atrial size was normal in size. Right Atrium: Right atrial size was mildly dilated. Pericardium: There is no evidence of pericardial effusion. Mitral Valve: The mitral valve is normal in structure. Trivial mitral valve regurgitation. No evidence of mitral valve stenosis. MV peak gradient, 4.4 mmHg. The mean mitral valve gradient is 2.0 mmHg. Tricuspid Valve: The tricuspid valve is normal in structure. Tricuspid valve regurgitation is trivial. Aortic Valve: The aortic valve is tricuspid. Aortic valve regurgitation is not visualized. No aortic stenosis is present. Aortic valve mean gradient measures 4.0 mmHg. Aortic valve peak gradient measures 7.3 mmHg. Aortic valve area, by VTI measures 3.48 cm. Pulmonic Valve: The pulmonic valve was grossly normal. Pulmonic valve regurgitation is trivial. Aorta: The aortic root is normal in size and structure. Venous: The inferior vena cava is dilated in size with greater than 50% respiratory variability, suggesting right atrial pressure of 8 mmHg. IAS/Shunts: No atrial level shunt detected by color flow Doppler.  LEFT VENTRICLE PLAX 2D LVIDd:         5.40 cm   Diastology LVIDs:         3.55 cm   LV e' medial:    11.70 cm/s LV PW:         1.40  cm   LV E/e' medial:  7.0 LV IVS:        1.00 cm   LV e' lateral:   12.10 cm/s LVOT diam:     2.30 cm   LV E/e' lateral: 6.8 LV SV:  112 LV SV Index:   53 LVOT Area:     4.15 cm  RIGHT VENTRICLE          IVC RV Basal diam:  5.15 cm  IVC diam: 2.10 cm RV Mid diam:    3.20 cm LEFT ATRIUM             Index        RIGHT ATRIUM           Index LA diam:        3.50 cm 1.67 cm/m   RA Area:     30.10 cm LA Vol (A2C):   56.1 ml 26.82 ml/m  RA Volume:   124.00 ml 59.29 ml/m LA Vol (A4C):   46.9 ml 22.42 ml/m LA Biplane Vol: 52.0 ml 24.86 ml/m  AORTIC VALVE AV Area (Vmax):    3.45 cm AV Area (Vmean):   3.21 cm AV Area (VTI):     3.48 cm AV Vmax:           135.00 cm/s AV Vmean:          95.500 cm/s AV VTI:            0.321 m AV Peak Grad:      7.3 mmHg AV Mean Grad:      4.0 mmHg LVOT Vmax:         112.00 cm/s LVOT Vmean:        73.700 cm/s LVOT VTI:          0.269 m LVOT/AV VTI ratio: 0.84  AORTA Ao Root diam: 3.30 cm Ao Asc diam:  3.40 cm MITRAL VALVE               TRICUSPID VALVE MV Area (PHT): 3.77 cm    TR Peak grad:   21.2 mmHg MV Area VTI:   4.40 cm    TR Vmax:        230.00 cm/s MV Peak grad:  4.4 mmHg MV Mean grad:  2.0 mmHg    SHUNTS MV Vmax:       1.04 m/s    Systemic VTI:  0.27 m MV Vmean:      56.4 cm/s   Systemic Diam: 2.30 cm MV Decel Time: 201 msec MV E velocity: 82.30 cm/s MV A velocity: 61.70 cm/s MV E/A ratio:  1.33 Dalton McleanMD Electronically signed by Wilfred Lacy Signature Date/Time: 08/08/2022/2:26:52 PM    Final    CT ANGIO HEAD NECK W WO CM (CODE STROKE)  Result Date: 08/08/2022 CLINICAL DATA:  Neuro deficit, acute, stroke suspected. Acute blurred vision, dizziness, and vomiting. EXAM: CT ANGIOGRAPHY HEAD AND NECK TECHNIQUE: Multidetector CT imaging of the head and neck was performed using the standard protocol during bolus administration of intravenous contrast. Multiplanar CT image reconstructions and MIPs were obtained to evaluate the vascular anatomy. Carotid stenosis  measurements (when applicable) are obtained utilizing NASCET criteria, using the distal internal carotid diameter as the denominator. RADIATION DOSE REDUCTION: This exam was performed according to the departmental dose-optimization program which includes automated exposure control, adjustment of the mA and/or kV according to patient size and/or use of iterative reconstruction technique. CONTRAST:  75mL OMNIPAQUE IOHEXOL 350 MG/ML SOLN COMPARISON:  Head and neck CTA 03/19/2015 FINDINGS: CTA NECK FINDINGS Aortic arch: Standard 3 vessel aortic arch with mild atherosclerotic plaque. Widely patent arch vessel origins. Right carotid system: Patent without evidence of stenosis, dissection, or significant atherosclerosis. Left carotid system: Patent with a small amount of calcified  plaque at the carotid bifurcation. No evidence of a significant stenosis or dissection. Vertebral arteries: Patent with the left being dominant. Increased calcified plaque at the right vertebral origin results in new mild stenosis. Skeleton: Advanced disc degeneration at C4-5. Asymmetrically severe left facet arthrosis at C3-4. Interbody and facet ankylosis at C2-3. Other neck: No evidence of cervical lymphadenopathy or mass. Upper chest: Clear lung apices. Review of the MIP images confirms the above findings CTA HEAD FINDINGS Anterior circulation: The internal carotid arteries are patent from skull base to carotid termini with minimal atherosclerotic plaque not resulting in significant stenosis. ACAs and MCAs are patent without evidence of a proximal branch occlusion or significant proximal stenosis. No aneurysm is identified. Posterior circulation: The intracranial vertebral arteries are patent to the basilar with the left being strongly dominant. Patent PICA and SCA origins are seen bilaterally. The basilar artery is widely patent. Posterior communicating arteries are diminutive or absent. The PCAs are patent without evidence of a significant  proximal stenosis. No aneurysm is identified. Venous sinuses: Patent. Anatomic variants: None. Review of the MIP images confirms the above findings These results were communicated to Dr. Iver NestleBhagat at 10:19 am on 08/08/2022 by text page via the [facility] messaging system. IMPRESSION: 1. Mild atherosclerosis in the head and neck without a large vessel occlusion or significant proximal intracranial stenosis. 2. New, mild stenosis of the right vertebral artery origin. 3.  Aortic Atherosclerosis (ICD10-I70.0). Electronically Signed   By: Sebastian AcheAllen  Grady M.D.   On: 08/08/2022 10:29   CT HEAD CODE STROKE WO CONTRAST  Result Date: 08/08/2022 CLINICAL DATA:  Code stroke.  56 year old male. EXAM: CT HEAD WITHOUT CONTRAST TECHNIQUE: Contiguous axial images were obtained from the base of the skull through the vertex without intravenous contrast. RADIATION DOSE REDUCTION: This exam was performed according to the departmental dose-optimization program which includes automated exposure control, adjustment of the mA and/or kV according to patient size and/or use of iterative reconstruction technique. COMPARISON:  Brain MRI and head CT 03/19/2015. FINDINGS: Brain: Cerebral volume remains within normal limits for age. No midline shift, ventriculomegaly, mass effect, evidence of mass lesion, intracranial hemorrhage or evidence of cortically based acute infarction. Gray-white matter differentiation is within normal limits throughout the brain. Vascular: Mild Calcified atherosclerosis at the skull base. Suspicious asymmetric hyperdensity of the right MCA M1. Skull: Negative. Sinuses/Orbits: Visualized paranasal sinuses and mastoids are clear. Other: Visualized orbits and scalp soft tissues are within normal limits. ASPECTS [facility]([place] Stroke Program Early CT Score) Total score (0-10 with 10 being normal): 10 IMPRESSION: 1. Suspicious asymmetric hyperdensity of the right MCA M1, but Normal noncontrast CT appearance of the brain. ASPECTS 10. 2.  These results were communicated to Dr. Iver NestleBhagat at 10:06 am on 08/08/2022 by text page via the [facility] messaging system. Electronically Signed   By: Odessa FlemingH  Hall M.D.   On: 08/08/2022 10:06      HISTORY OF PRESENT ILLNESS Jennelle HumanCharles H Petteway is a 56 y.o. male with a PMH of HLD, anxiety, and syncope presenting with an acute onset of dizziness starting at 0900 while at work.  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  EMS was called.  Blood pressure with EMS elevated at 160/80.  CBG 116.  He did have 2 episodes of vomiting in the ambulance and is endorsing dizziness, nausea, visual disturbance.  He denies numbness, tingling, or focal weakness.   Of note, he had a presentation in 2016 with left-sided weakness and  numbness as well as some right-sided symptoms which was ultimately felt to be most likely related to anxiety versus musculoskeletal issue.   LKW: 0900 IV thrombolysis given?: Yes Management with thrombolytic therapy was explained to the patient, or patient's representative, as were risks, benefits and alternatives. All questions were answered. Patient, or patient's representative, expressed understanding of the treatment plan and agreed to proceed with thrombolytic treatment.   Premorbid modified Rankin scale (mRS):  IR: No, LVO not suspected 0-Completely asymptomatic and back to baseline post-stroke   HOSPITAL COURSE Guy Thomas is a 56 y.o. male with history of hyperlipidemia, anxiety, syncope admitted for dizziness, nausea vomiting x2 and visual disturbance.  TNK given.     Stroke: Could be DWI negative posterior stroke status post TNK, secondary to small vessel disease source CT no acute abnormality CTA head and neck unremarkable MRI no acute infarct 2D Echo EF 50 to 55% LDL 62 HgbA1c 5.8 Lovenox for VTE prophylaxis aspirin 325 mg daily prior to admission, now on aspirin 81 mg daily and clopidogrel 75 mg daily DAPT for 3 weeks and  then ASA alone Patient counseled to be compliant with his antithrombotic medications Ongoing aggressive stroke risk factor management Therapy recommendations: Outpatient PT/OT Disposition: Pending   Less likely vestibular neuritis Differential diagnosis of patient's symptoms including vestibular neuritis  Supporting point including MRI negative, nystagmus in one direction, no skew with cover/uncover test However had a pulse test negative (although nystagmus somehow interferes to test a little bit), and close eyes feels better Will treat as DWI negative stroke at this time.   Hypertension Stable Long term BP goal normotensive   Hyperlipidemia Home meds:  crestor 10  LDL 62, goal < 70 Now on crestor 10 Continue statin at discharge   Other Stroke Risk Factors     Other Active Problems Anxiety  syncope   DISCHARGE EXAM Blood pressure 117/74, pulse 67, temperature 98.2 F (36.8 C), temperature source Oral, resp. rate 19, height  (1.803 m), weight 79.3 kg, SpO2 97 %.  General - Well nourished, well developed, in no apparent distress.   Ophthalmologic - fundi not visualized due to noncooperation.   Cardiovascular - Regular rhythm and rate.   Mental Status -  Level of arousal and orientation to time, place, and person were intact. Language including expression, naming, repetition, comprehension was assessed and found intact. Fund of Knowledge was assessed and was intact.   Cranial Nerves II - XII - II - Visual field intact OU. III, IV, VI - Extraocular movements intact. V - Facial sensation intact bilaterally. VII - Facial movement intact bilaterally. VIII - Hearing intact bilaterally. Left gaze nystagmus direction to left. Right gaze no nystagmus. Upper gaze and downward gaze no nystagmus. Head pulse test negative but can be masked by nystagmus. No skew with cover/uncover test.  X - Palate elevates symmetrically. XI - Chin turning & shoulder shrug intact  bilaterally. XII - Tongue protrusion intact.   Motor Strength - The patient's strength was normal in all extremities and pronator drift was absent.  Bulk was normal and fasciculations were absent.   Motor Tone - Muscle tone was assessed at the neck and appendages and was normal.   Reflexes - The patient's reflexes were symmetrical in all extremities and he had no pathological reflexes.   Sensory - Light touch, temperature/pinprick were assessed and were symmetrical.     Coordination - The patient had normal movements in the hands and feet with no ataxia or  dysmetria.  Tremor was absent.   Gait and Station - deferred.  Discharge Diet       Diet   Diet Heart Room service appropriate? Yes; Fluid consistency: Thin   liquids  DISCHARGE PLAN Disposition:  home aspirin 81 mg daily and clopidogrel 75 mg daily for secondary stroke prevention for 3 weeks then ASA alone. Ongoing stroke risk factor control by Primary Care Physician at time of discharge Follow-up PCP [name], MD in 2 weeks. Follow-up in [facility] in 4 weeks, office to schedule an appointment.   35 minutes were spent preparing discharge.  Marvel Plan, MD PhD Stroke Neurology 08/10/2022 1:05 PM

## 2022-10-11 ENCOUNTER — Encounter: Payer: Self-pay | Admitting: Family Medicine

## 2022-10-11 ENCOUNTER — Ambulatory Visit: Payer: Managed Care, Other (non HMO) | Admitting: Family Medicine

## 2022-10-11 VITALS — BP 128/83 | HR 72 | Ht 71.0 in | Wt 193.2 lb

## 2022-10-11 DIAGNOSIS — I639 Cerebral infarction, unspecified: Secondary | ICD-10-CM | POA: Diagnosis not present

## 2022-10-11 DIAGNOSIS — E785 Hyperlipidemia, unspecified: Secondary | ICD-10-CM | POA: Insufficient documentation

## 2022-10-11 NOTE — Progress Notes (Unsigned)
[facility] [address] [place]. [place] [address] [phone]       HOSPITAL FOLLOW UP NOTE  Guy Thomas Date of Birth:  1966/05/23 Medical Record Number:  [id number]   Reason for Referral:  hospital stroke follow up    SUBJECTIVE:   CHIEF COMPLAINT:  Chief Complaint  Patient presents with   New Patient (Initial Visit)    RM 1, alone. Hospital f/u for TIA. Went to work and had vertigo. EMS came. Vertigo lasted a little over 2 days. This was first time this ever happened. Doing well, no episodes since.    HPI:   Guy Thomas is a 57 y.o. who  has a past medical history of Asthma and Diabetes mellitus without complication ([place]).  Patient presented on 08/08/2022 with sudden onset of dizziness with double/triple vision around 9am while at work. He had two episodes of vomiting with EMS. History of presentation to ER in 2016 with left sided weakness and numbness with questionable right sided symptoms as well which was felt to be related to anxiety/musculoskeletal issue. Imaging was unremarkable. TNK given. He had been on asa 325mg  and Crestor 10mg  daily that were continued. LDL 62. Plavix added to asa x 3 weeks then advised to continue Plavix only. PT/OT recommended outpatient therapy due to continued dizziness. He was discharged home 08/10/2022. Personally reviewed hospitalization pertinent progress notes, lab work and imaging.  Evaluated by Dr Erlinda Hong.   Since discharge, he is doing very well. He reports dizziness resolved after returning home and has not returned. He denies residual vision changes. 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 He is exercising 4-5 times a week. He runs a mile on the treadmill and does light resistance training. He was seen by PCP recently. He has continues Crestor 81mg  and asa 325mg  daily. He reports being on asa 325mg  prior to event despite previous reports of taking asa 81 mg. He took Plavix for 3 weeks then  discontinued. He did not feel PT/OT therapy was needed. No family history of stroke. He denies history of migraines. He did have headache during event that quickly resolved. BP was > 200/100 at time of EMS call. BP is usually well managed. No HTN agents. He has never been diagnosed with vertigo.    PERTINENT IMAGING/LABS  CT no acute abnormality CTA head and neck unremarkable MRI no acute infarct 2D Echo EF 50 to 55%   A1C Lab Results  Component Value Date   HGBA1C 5.8 (H) 08/08/2022    Lipid Panel     Component Value Date/Time   CHOL 137 08/09/2022 0324   TRIG 135 08/09/2022 0324   HDL 48 08/09/2022 0324   CHOLHDL 2.9 08/09/2022 0324   VLDL 27 08/09/2022 0324   LDLCALC 62 08/09/2022 0324      ROS:   14 system review of systems performed and negative with exception of those listed in HPI  PMH:  Past Medical History:  Diagnosis Date   Asthma    Diabetes mellitus without complication ([place])     PSH: History reviewed. No pertinent surgical history.  Social History:  Social History   Socioeconomic History   Marital status: Single    Spouse name: Candy   Number of children: 3   Years of education: 12   Highest education level: Not on file  Occupational History   Not on file  Tobacco Use   Smoking status: Never   Smokeless tobacco: Not on file  Substance and Sexual Activity   Alcohol use: No   Drug use: No   Sexual activity: Not on file  Other Topics Concern   Not on file  Social History Narrative   Lives with wife/son   Right handed   Caffeine use: none   Drinks only water   Social Determinants of Health   Financial Resource Strain: Not on file  Food Insecurity: No Food Insecurity (08/09/2022)   Hunger Vital Sign    Worried About Running Out of Food in the Last Year: Never true    Ran Out of Food in the Last Year: Never true  Transportation Needs: No Transportation Needs (08/09/2022)   PRAPARE - Hydrologist (Medical):  No    Lack of Transportation (Non-Medical): No  Physical Activity: Not on file  Stress: Not on file  Social Connections: Not on file  Intimate Partner Violence: Not At Risk (08/09/2022)   Humiliation, Afraid, Rape, and Kick questionnaire    Fear of Current or Ex-Partner: No    Emotionally Abused: No    Physically Abused: No    Sexually Abused: No    Family History: History reviewed. No pertinent family history.  Medications:   Current Outpatient Medications on File Prior to Visit  Medication Sig Dispense Refill   acetaminophen (TYLENOL) 500 MG tablet Take 1,000 mg by mouth every Monday, Tuesday, Wednesday, Thursday, and Friday. (1100)     ALPRAZolam (XANAX) 0.5 MG tablet Take 0.25-0.5 mg by mouth at bedtime as needed for sleep.     ANDROGEL PUMP 20.25 MG/ACT (1.62%) GEL Apply 4 Pump topically at bedtime.  0   aspirin EC 81 MG tablet Take 1 tablet (81 mg total) by mouth daily. Swallow whole. 30 tablet 12   meloxicam (MOBIC) 15 MG tablet Take 30 mg by mouth daily as needed for pain.     Multiple Vitamin (MULTIVITAMIN) tablet Take 1 tablet by mouth every Monday, Tuesday, Wednesday, Thursday, and Friday. (1100)     rosuvastatin (CRESTOR) 10 MG tablet Take 10 mg by mouth at bedtime.     No current facility-administered medications on file prior to visit.    Allergies:   Allergies  Allergen Reactions   Penicillins Hives   Simvastatin     Chest pain, dizziness      OBJECTIVE:  Physical Exam  Vitals:   10/11/22 1511  BP: 128/83  Pulse: 72  Weight: 193 lb 3.2 oz (87.6 kg)  Height: 5\' 11"  (1.803 m)   Body mass index is 26.95 kg/m. No results found.      No data to display           General: well developed, well nourished, seated, in no evident distress Head: head normocephalic and atraumatic.   Neck: supple with no carotid or supraclavicular bruits Cardiovascular: regular rate and rhythm, no murmurs Musculoskeletal: no deformity Skin:  no  rash/petichiae Vascular:  Normal pulses all extremities   Neurologic Exam Mental Status: Awake and fully alert.  Fluent speech and language.  Oriented to place and time. Recent and remote memory intact. Attention span, concentration and fund of knowledge appropriate. Mood and affect appropriate.  Cranial Nerves: Fundoscopic exam reveals sharp disc margins. Pupils equal, briskly reactive to light. Extraocular movements full without nystagmus. Visual fields full to confrontation. Hearing intact. Facial sensation intact. Face, tongue, palate moves normally and symmetrically.  Motor: Normal bulk and tone. Normal strength in all tested extremity muscles Sensory.: intact to touch , pinprick ,  position and vibratory sensation.  Coordination: Rapid alternating movements normal in all extremities. Finger-to-nose and heel-to-shin performed accurately bilaterally. Gait and Station: Arises from chair without difficulty. Stance is normal. Gait demonstrates normal stride length and balance with no assistive device. Reflexes: 1+ and symmetric.    NIHSS  0 Modified Rankin  0    ASSESSMENT: Guy Thomas is a 57 y.o. year old male presenting to ER on 08/08/2022 with sudden onset of dizziness with double/triple vision. Vascular risk factors include HLD, prediabetes.      PLAN:  Stroke: Could be DWI negative posterior stroke status post TNK, secondary to small vessel disease source : Residual deficit: none. Continue clopidogrel 75 mg daily and Crestor 10mg  daily for secondary stroke prevention.  Discussed secondary stroke prevention measures and importance of close PCP follow up for aggressive stroke risk factor management. I have gone over the pathophysiology of stroke, warning signs and symptoms, risk factors and their management in some detail with instructions to go to the closest emergency room for symptoms of concern. HTN: BP goal <130/90.  Stable on no HTN agents. Continue to monitor per PCP HLD:  LDL goal <70. Recent LDL 62. Continue Crestor 10mg  daily per PCP.  DMII: A1c goal<7.0. Recent A1c 5.8. Prediabetes. Recommend well balanced diet and regular exercise. Follow up with PCP.  Anxiety: continue to monitor. Follow up closely with PCP.    Follow up as needed   CC:  [facility] provider: Dr. Leonie Man PCP: Shirline Frees, MD    I spent *** minutes of face-to-face and non-face-to-face time with patient.  This included previsit chart review including review of recent hospitalization, lab review, study review, order entry, electronic health record documentation, patient education regarding recent stroke including etiology, secondary stroke prevention measures and importance of managing stroke risk factors, residual deficits and typical recovery time and answered all other questions to patient satisfaction   Debbora Presto, [facility]  [facility] [address] [place], [place] [address]  Phone [phone] Fax [phone] Note: This document was prepared with digital dictation and possible smart phrase technology. Any transcriptional errors that result from this process are unintentional.

## 2022-10-11 NOTE — Patient Instructions (Addendum)
Below is our plan:  We will continue to monitor. Continue aspirin 325mg  daily. I will discuss Plavix with Dr Leonie Man.   Please make sure you are staying well hydrated. I recommend 50-60 ounces daily. Well balanced diet and regular exercise encouraged. Consistent sleep schedule with 6-8 hours recommended.   Please continue follow up with care team as directed.   Follow up with me as needed   You may receive a survey regarding today's visit. I encourage you to leave honest feed back as I do use this information to improve patient care. Thank you for seeing me today!

## 2022-10-13 ENCOUNTER — Encounter: Payer: Self-pay | Admitting: Family Medicine

## 2022-10-20 ENCOUNTER — Telehealth: Payer: Self-pay | Admitting: *Deleted

## 2022-10-20 NOTE — Telephone Encounter (Signed)
-----   Message from Debbora Presto, NP sent at 10/20/2022  3:27 PM EST ----- Can you guys please call and let him know that I was able to discuss the case with Dr Leonie Man and he did not recommend any further imaging or workup and felt that he would be fine to continue aspirin. He did recommend 81mg  over 325mg . Remind him to follow up closely ith his primary care provider and he can call us if he needs [place]. TY! ----- Message ----- From: Garvin Fila, MD Sent: 10/14/2022   4:31 PM EST To: Debbora Presto, NP  I think there is no need for repeat imaging.  Since MRI was negative this was probably a strokelike event.  He could stay on aspirin but drop the dose to 81 mg.  No need for Plavix ----- Message ----- From: Debbora Presto, NP Sent: 10/13/2022  11:32 AM EST To: Garvin Fila, MD  Could you review this note, please. Should I repeat imaging? Any other work up needed as diagnosis unclear? He was taking Aspirin 325mg  prior to event and questioned recommendation to replace with Plavix. Would you advise Plavix? TY!!!

## 2022-10-20 NOTE — Telephone Encounter (Signed)
Left message for patient to call.

## 2022-10-25 NOTE — Telephone Encounter (Signed)
Left message for patient to call again.

## 2022-11-02 NOTE — Telephone Encounter (Signed)
Left message for patient to call again.

## 2022-11-03 ENCOUNTER — Encounter: Payer: Self-pay | Admitting: *Deleted

## 2022-11-03 NOTE — Telephone Encounter (Signed)
Letter mailed to patient, pt never called office back to discuss the below.

## 2023-08-09 ENCOUNTER — Other Ambulatory Visit: Payer: Self-pay | Admitting: Neurological Surgery

## 2023-08-09 DIAGNOSIS — M542 Cervicalgia: Secondary | ICD-10-CM

## 2023-08-24 ENCOUNTER — Encounter: Payer: Self-pay | Admitting: Neurological Surgery

## 2023-09-03 ENCOUNTER — Ambulatory Visit
Admission: RE | Admit: 2023-09-03 | Discharge: 2023-09-03 | Disposition: A | Discharge status: Home / Self Care | Payer: Managed Care, Other (non HMO) | Source: Ambulatory Visit | Attending: Neurological Surgery | Admitting: Neurological Surgery

## 2023-09-03 DIAGNOSIS — M542 Cervicalgia: Secondary | ICD-10-CM

## 2023-11-15 ENCOUNTER — Ambulatory Visit (HOSPITAL_COMMUNITY)
Admission: RE | Admit: 2023-11-15 | Discharge: 2023-11-15 | Disposition: A | Discharge status: Home / Self Care | Source: Ambulatory Visit | Attending: Cardiology | Admitting: Cardiology

## 2023-11-15 ENCOUNTER — Other Ambulatory Visit (HOSPITAL_COMMUNITY): Payer: Self-pay | Admitting: Physician Assistant

## 2023-11-15 DIAGNOSIS — I998 Other disorder of circulatory system: Secondary | ICD-10-CM

## 2023-11-20 NOTE — Progress Notes (Unsigned)
 Patient name: Guy Thomas MRN: [id number] DOB: November 30, 1965 Sex: male  REASON FOR CONSULT: Diminished PPG flow to fourth digit of right hand  HPI: Guy Thomas is a 58 y.o. male, with history of diabetes the presents for evaluation of diminished PPG flow to fourth digit of right hand.  Past Medical History:  Diagnosis Date   Asthma    Diabetes mellitus without complication ([facility])     No past surgical history on file.  No family history on file.  SOCIAL HISTORY: Social History   Socioeconomic History   Marital status: Single    Spouse name: Candy   Number of children: 3   Years of education: 12   Highest education level: Not on file  Occupational History   Not on file  Tobacco Use   Smoking status: Never   Smokeless tobacco: Not on file  Substance and Sexual Activity   Alcohol use: No   Drug use: No   Sexual activity: Not on file  Other Topics Concern   Not on file  Social History Narrative   Lives with wife/son   Right handed   Caffeine use: none   Drinks only water   Social Drivers of Health   Financial Resource Strain: Not on file  Food Insecurity: No Food Insecurity (08/09/2022)   Hunger Vital Sign    Worried About Running Out of Food in the Last Year: Never true    Ran Out of Food in the Last Year: Never true  Transportation Needs: No Transportation Needs (08/09/2022)   PRAPARE - Administrator, Civil Service (Medical): No    Lack of Transportation (Non-Medical): No  Physical Activity: Not on file  Stress: Not on file  Social Connections: Not on file  Intimate Partner Violence: Not At Risk (08/09/2022)   Humiliation, Afraid, Rape, and Kick questionnaire    Fear of Current or Ex-Partner: No    Emotionally Abused: No    Physically Abused: No    Sexually Abused: No    Allergies  Allergen Reactions   Penicillins Hives   Simvastatin     Chest pain, dizziness    Current Outpatient Medications  Medication Sig Dispense  Refill   acetaminophen (TYLENOL) 500 MG tablet Take 1,000 mg by mouth every Monday, Tuesday, Wednesday, Thursday, and Friday. (1100)     ALPRAZolam (XANAX) 0.5 MG tablet Take 0.25-0.5 mg by mouth at bedtime as needed for sleep.     ANDROGEL PUMP 20.25 MG/ACT (1.62%) GEL Apply 4 Pump topically at bedtime.  0   aspirin EC 81 MG tablet Take 1 tablet (81 mg total) by mouth daily. Swallow whole. 30 tablet 12   meloxicam (MOBIC) 15 MG tablet Take 30 mg by mouth daily as needed for pain.     Multiple Vitamin (MULTIVITAMIN) tablet Take 1 tablet by mouth every Monday, Tuesday, Wednesday, Thursday, and Friday. (1100)     rosuvastatin (CRESTOR) 10 MG tablet Take 10 mg by mouth at bedtime.     Testosterone 20.25 MG/ACT (1.62%) GEL Place 20.25 mg onto the skin daily.     No current facility-administered medications for this visit.    REVIEW OF SYSTEMS:  [X]  denotes positive finding, [ ]  denotes negative finding Cardiac  Comments:  Chest pain or chest pressure: ***   Shortness of breath upon exertion:    Short of breath when lying flat:    Irregular heart rhythm:        Vascular    Pain  in calf, thigh, or hip brought on by ambulation:    Pain in feet at night that wakes you up from your sleep:     Blood clot in your veins:    Leg swelling:         Pulmonary    Oxygen at home:    Productive cough:     Wheezing:         Neurologic    Sudden weakness in arms or legs:     Sudden numbness in arms or legs:     Sudden onset of difficulty speaking or slurred speech:    Temporary loss of vision in one eye:     Problems with dizziness:         Gastrointestinal    Blood in stool:     Vomited blood:         Genitourinary    Burning when urinating:     Blood in urine:        Psychiatric    Major depression:         Hematologic    Bleeding problems:    Problems with blood clotting too easily:        Skin    Rashes or ulcers:        Constitutional    Fever or chills:      PHYSICAL  EXAM: There were no vitals filed for this visit.  GENERAL: The patient is a well-nourished male, in no acute distress. The vital signs are documented above. CARDIAC: There is a regular rate and rhythm.  VASCULAR: *** PULMONARY: There is good air exchange bilaterally without wheezing or rales. ABDOMEN: Soft and non-tender with normal pitched bowel sounds.  MUSCULOSKELETAL: There are no major deformities or cyanosis. NEUROLOGIC: No focal weakness or paresthesias are detected. SKIN: There are no ulcers or rashes noted. PSYCHIATRIC: The patient has a normal affect.  DATA:   UPPER EXTREMITY DOPPLER STUDY   Patient Name:  Guy Thomas  Date of Exam:   11/15/2023  Medical Rec #: [id number]           Accession #:    [id number]  Date of Birth: July 28, 1966           Patient Gender: M  Patient Age:   58 years  Exam Location:  [facility]  Procedure:      VAS UE DOPPLER BILAT/COMP TOS, DIGITS (TO&UE REYNAUDS)  Referring Phys: Sanda Klein, PA    ---------------------------------------------------------------------------  -----    Indications: Right fourth finger numbness and coldness.   Risk Factors:  Hyperlipidemia, prior CVA.  Other Factors: TIA   Comparison Study: No prior study   Performing Technologist: Gertie Fey MHA, RVT, RDCS, RDMS     Examination Guidelines: A complete evaluation includes B-mode imaging,  spectral  Doppler, color Doppler, and power Doppler as needed of all accessible  portions  of each vessel. Bilateral testing is considered an integral part of a  complete  examination. Limited examinations for reoccurring indications may be  performed  as noted.     Right Doppler Findings:  +--------+--------+-----+---------+--------+  Site   PressureIndexDoppler  Comments  +--------+--------+-----+---------+--------+  [id number]         triphasic          +--------+--------+-----+---------+--------+  Radial 229     1.57 triphasic           +--------+--------+-----+---------+--------+  Ulnar  214     1.47 triphasic          +--------+--------+-----+---------+--------+  Left Doppler Findings:  +--------+--------+-----+---------+--------+  Site   PressureIndexDoppler  Comments  +--------+--------+-----+---------+--------+  [id number]         triphasic          +--------+--------+-----+---------+--------+  Radial 173     1.18 triphasic          +--------+--------+-----+---------+--------+  Ulnar  163     1.12 triphasic          +--------+--------+-----+---------+--------+     Technologist Notes:   Right: PPG tracings from the 4th finger demonstrate diminished amplitude.     Summary:    Right: Significantly diminished PPG amplitude of the right fourth         finger, as mentioned above.  Left: No significant arterial obstruction detected in the left upper        extremity.  *See table(s) above for measurements and observations.   Suggest Peripheral Vascular Consult.   Electronically signed by Nanetta Batty MD on 11/15/2023 at 7:20:22 PM.      Assessment/Plan:  58 y.o. male, with history of diabetes the presents for evaluation of diminished PPG flow to fourth digit of right hand.   Cephus Shelling, MD [facility] Office: [phone]

## 2023-11-21 ENCOUNTER — Encounter: Payer: Self-pay | Admitting: Vascular Surgery

## 2023-11-21 ENCOUNTER — Ambulatory Visit: Admitting: Vascular Surgery

## 2023-11-21 VITALS — BP 132/90 | HR 81 | Temp 98.3°F | Resp 20 | Ht 71.0 in | Wt 196.2 lb

## 2023-11-21 DIAGNOSIS — R200 Anesthesia of skin: Secondary | ICD-10-CM | POA: Diagnosis not present

## 2023-11-24 ENCOUNTER — Other Ambulatory Visit: Payer: Self-pay | Admitting: *Deleted

## 2023-11-24 DIAGNOSIS — M25511 Pain in right shoulder: Secondary | ICD-10-CM

## 2023-11-24 DIAGNOSIS — R200 Anesthesia of skin: Secondary | ICD-10-CM

## 2024-01-02 ENCOUNTER — Encounter: Payer: Self-pay | Admitting: Vascular Surgery

## 2024-01-02 ENCOUNTER — Ambulatory Visit: Admitting: Vascular Surgery

## 2024-01-02 ENCOUNTER — Ambulatory Visit (HOSPITAL_COMMUNITY)
Admission: RE | Admit: 2024-01-02 | Discharge: 2024-01-02 | Disposition: A | Discharge status: Home / Self Care | Source: Ambulatory Visit | Attending: Vascular Surgery | Admitting: Vascular Surgery

## 2024-01-02 VITALS — BP 135/79 | HR 74 | Temp 98.3°F | Resp 18 | Ht 71.0 in | Wt 188.5 lb

## 2024-01-02 DIAGNOSIS — M25511 Pain in right shoulder: Secondary | ICD-10-CM | POA: Insufficient documentation

## 2024-01-02 DIAGNOSIS — R200 Anesthesia of skin: Secondary | ICD-10-CM | POA: Insufficient documentation

## 2024-01-02 NOTE — Progress Notes (Signed)
 Patient name: Guy Thomas MRN: [id number] DOB: 1966/08/30 Sex: male  REASON FOR CONSULT: Right 4th digit cold and numb  HPI: Guy Thomas is a 58 y.o. male, no significant medical history that presents for ongoing follow-up for evaluation of diminished PPG flow to fourth digit of right hand.  He states his right fourth digit feels numb and cold.  This has been ongoing for the last several months now.  He has had no associated trauma.  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  Ultimately he is back for arterial duplex to ensure there is no proximal embolizing lesion.  States the finger is about the same but does not bother him much.  Past Medical History:  Diagnosis Date   Asthma    Diabetes mellitus without complication (HCC)     History reviewed. No pertinent surgical history.  History reviewed. No pertinent family history.  SOCIAL HISTORY: Social History   Socioeconomic History   Marital status: Single    Spouse name: Candy   Number of children: 3   Years of education: 12   Highest education level: Not on file  Occupational History   Not on file  Tobacco Use   Smoking status: Never   Smokeless tobacco: Not on file  Vaping Use   Vaping status: Never Used  Substance and Sexual Activity   Alcohol use: No   Drug use: No   Sexual activity: Not on file  Other Topics Concern   Not on file  Social History Narrative   Lives with wife/son   Right handed   Caffeine use: none   Drinks only water   Social Drivers of Health   Financial Resource Strain: Not on file  Food Insecurity: No Food Insecurity (08/09/2022)   Hunger Vital Sign    Worried About Running Out of Food in the Last Year: Never true    Ran Out of Food in the Last Year: Never true  Transportation Needs: No Transportation Needs (08/09/2022)   PRAPARE - Administrator, Civil Service (Medical): No    Lack of Transportation (Non-Medical): No  Physical Activity: Not on file   Stress: Not on file  Social Connections: Not on file  Intimate Partner Violence: Not At Risk (08/09/2022)   Humiliation, Afraid, Rape, and Kick questionnaire    Fear of Current or Ex-Partner: No    Emotionally Abused: No    Physically Abused: No    Sexually Abused: No    Allergies  Allergen Reactions   Penicillins Hives   Simvastatin     Chest pain, dizziness    Current Outpatient Medications  Medication Sig Dispense Refill   acetaminophen  (TYLENOL ) 500 MG tablet Take 1,000 mg by mouth every Monday, Tuesday, Wednesday, Thursday, and Friday. (1100)     ALPRAZolam  (XANAX ) 0.5 MG tablet Take 0.25-0.5 mg by mouth at bedtime as needed for sleep.     ANDROGEL  PUMP 20.25 MG/ACT (1.62%) GEL Apply 4 Pump topically at bedtime.  0   aspirin  EC 81 MG tablet Take 1 tablet (81 mg total) by mouth daily. Swallow whole. 30 tablet 12   meloxicam (MOBIC) 15 MG tablet Take 30 mg by mouth daily as needed for pain.     Multiple Vitamin (MULTIVITAMIN) tablet Take 1 tablet by mouth every Monday, Tuesday, Wednesday, Thursday, and Friday. (1100)     rosuvastatin  (CRESTOR ) 10 MG tablet Take 10 mg by mouth at bedtime.     Testosterone  20.25 MG/ACT (  1.62%) GEL Place 20.25 mg onto the skin daily.     No current facility-administered medications for this visit.    REVIEW OF SYSTEMS:  [X]  denotes positive finding, [ ]  denotes negative finding Cardiac  Comments:  Chest pain or chest pressure:    Shortness of breath upon exertion:    Short of breath when lying flat:    Irregular heart rhythm:        Vascular    Pain in calf, thigh, or hip brought on by ambulation:    Pain in feet at night that wakes you up from your sleep:     Blood clot in your veins:    Leg swelling:         Pulmonary    Oxygen at home:    Productive cough:     Wheezing:         Neurologic    Sudden weakness in arms or legs:     Sudden numbness in arms or legs:     Sudden onset of difficulty speaking or slurred speech:     Temporary loss of vision in one eye:     Problems with dizziness:         Gastrointestinal    Blood in stool:     Vomited blood:         Genitourinary    Burning when urinating:     Blood in urine:        Psychiatric    Major depression:         Hematologic    Bleeding problems:    Problems with blood clotting too easily:        Skin    Rashes or ulcers:        Constitutional    Fever or chills:      PHYSICAL EXAM: Vitals:   01/02/24 1405  BP: 135/79  Pulse: 74  Resp: 18  Temp: 98.3 F (36.8 C)  TempSrc: Temporal  SpO2: 93%  Weight: 188 lb 8 oz (85.5 kg)  Height: 5\' 11"  (1.803 m)    GENERAL: The patient is a well-nourished male, in no acute distress. The vital signs are documented above. CARDIAC: There is a regular rate and rhythm.  VASCULAR:  Right radial and ulnar pulse palpable at the wrist with no tissue loss PULMONARY: No respiratory distress. ABDOMEN: Soft and non-tender. MUSCULOSKELETAL: There are no major deformities or cyanosis. NEUROLOGIC: No focal weakness or paresthesias are detected. PSYCHIATRIC: The patient has a normal affect.  DATA:      UPPER EXTREMITY DUPLEX STUDY   Patient Name:  Guy Thomas  Date of Exam:   01/02/2024  Medical Rec #: [id number]           Accession #:    [id number]  Date of Birth: 1966/03/11           Patient Gender: M  Patient Age:   27 years  Exam Location:  [facility]  Procedure:      VAS US  UPPER EXTREMITY ARTERIAL DUPLEX  Referring Phys: Jimmye Moulds    ---------------------------------------------------------------------------  -----    Indications: UE PAD, Right 4th finger discoloration/cold, numbness.  History:     Patient has a history of Right Upper Extremity Digit - Cold.   Risk Factors:  Hyperlipidemia.  Other Factors: Stroke/TIA   Comparison Study: 11/15/23   Performing Technologist: Jenifer Miu RVT     Examination Guidelines: A complete evaluation includes B-mode  imaging,  spectral  Doppler, color Doppler, and power Doppler as needed of all accessible  portions  of each vessel. Bilateral testing is considered an integral part of a  complete  examination. Limited examinations for reoccurring indications may be  performed  as noted.     Right Doppler Findings:  +---------------+----------+---------+-----------+  Site          PSV (cm/s)Waveform Stenosis     +---------------+----------+---------+-----------+  Subclavian Prox147       triphasicno stenosis  +---------------+----------+---------+-----------+  Subclavian Mid 92        triphasicno stenosis  +---------------+----------+---------+-----------+  Subclavian Dist99        triphasicno stenosis  +---------------+----------+---------+-----------+  Axillary      113       triphasicno stenosis  +---------------+----------+---------+-----------+  Brachial Prox  110       triphasicno stenosis  +---------------+----------+---------+-----------+  Brachial Mid   114       triphasicno stenosis  +---------------+----------+---------+-----------+  Brachial Dist  84        triphasicno stenosis  +---------------+----------+---------+-----------+  Radial Prox    70        triphasicno stenosis  +---------------+----------+---------+-----------+  Radial Mid     80        triphasicno stenosis  +---------------+----------+---------+-----------+  Radial Dist    108       triphasicno stenosis  +---------------+----------+---------+-----------+  Ulnar Prox     63        triphasicno stenosis  +---------------+----------+---------+-----------+  Ulnar Mid      99        triphasicno stenosis  +---------------+----------+---------+-----------+  Ulnar Dist     96        triphasicno stenosis  +---------------+----------+---------+-----------+     Summary:    Right: No obstruction visualized in the right upper extremity.  *See table(s)  above for measurements and observations.     Electronically signed by Jimmye Moulds MD on 01/02/2024 at 2:14:32 PM.   Assessment/Plan:  58 y.o. male, no significant medical history that presents for further follow-up and evaluation of diminished PPG flow to fourth digit of right hand.  He states his right fourth digit feels numb and cold.  This has been ongoing for the last several months.  Ultimately brought him back for right upper extremity arterial duplex to ensure there is no inflow stenosis or other evidence of embolizing lesion.  Duplex today is completely normal with triphasic flow and no stenosis in the right upper extremity.  He has palpable radial and ulnar pulses at the wrist.  I do not think there is any concern for a vascular etiology for my standpoint.  This does not seem like an atheroembolic event.  He can follow-up as needed.   Young Hensen, MD [facility] Office: [phone]

## 2024-02-22 ENCOUNTER — Encounter: Payer: Self-pay | Admitting: Podiatry

## 2024-02-22 ENCOUNTER — Ambulatory Visit (INDEPENDENT_AMBULATORY_CARE_PROVIDER_SITE_OTHER)

## 2024-02-22 ENCOUNTER — Ambulatory Visit: Admitting: Podiatry

## 2024-02-22 DIAGNOSIS — M7661 Achilles tendinitis, right leg: Secondary | ICD-10-CM | POA: Diagnosis not present

## 2024-02-22 DIAGNOSIS — M722 Plantar fascial fibromatosis: Secondary | ICD-10-CM

## 2024-02-22 DIAGNOSIS — M7731 Calcaneal spur, right foot: Secondary | ICD-10-CM | POA: Diagnosis not present

## 2024-02-22 MED ORDER — TRIAMCINOLONE ACETONIDE 10 MG/ML IJ SUSP
10.0000 mg | Freq: Once | INTRAMUSCULAR | Status: AC
  Administered 2024-02-22: 10 mg

## 2024-02-22 NOTE — Progress Notes (Signed)
 Patient presents with complaint of pain on the right heel plantarly and posteriorly.  Pain at the arch of the foot and also the Achilles tendon.  Gets cramping in the calf.  The having pain for several weeks no recollection of injury to it.  Has not noticed any redness or swelling.   Physical exam:  General appearance: Pleasant, and in no acute distress. AOx3.  Vascular: Pedal pulses: DP 2/4 bilaterally, PT 2/4 bilaterally.  No edema lower legs bilaterally. Capillary fill time immediate.  Neurological: Light touch intact feet bilaterally.  Normal Achilles reflex bilaterally.  No clonus or spasticity noted.  Negative Tinel's sign tarsal tunnel and porta pedis right  Dermatologic:   Skin normal temperature bilaterally.  Skin normal color, tone, and texture bilaterally.   Musculoskeletal: Tenderness plantar aspect heel especially at the medial calcaneal tubercle.  Tenderness along the proximal one third of the plantar fascial band.  No fibromas noted.  Tenderness on the posterior aspect of the heel and the distal several centimeters of the Achilles tendon.  No defects in Achilles tendon noted.  No tenderness with lateral compression of the This.  Radiographs: 3 views right foot: Osteophytic changes posterior aspect calcaneus.  None no calcaneal spurs plantarly.  Notes any fractures or dislocations no erosive changes noted.  Normal bone density.  No evidence any bone tumors.  Diagnosis: 1.  Achilles tendinitis right. 2.  Plantar fasciitis right. 3.  Calcaneal spur right.  Plan: -New patient visit level 3 for evaluation management. - Discussed plantar fascia out fasciitis and etiology and treatment.  Will try an injection today.  Will also give him stretching exercises and icing exercises he can do.  Discussed proper shoes. - -injected 3cc 2:1 mixture 0.5 cc Marcaine:Kenolog 10mg /41ml at medial origin plantar fascial band right.     Return return 1 week for follow-up plantar  fasciitis

## 2024-03-04 ENCOUNTER — Ambulatory Visit: Admitting: Podiatry

## 2024-03-04 DIAGNOSIS — M722 Plantar fascial fibromatosis: Secondary | ICD-10-CM | POA: Diagnosis not present

## 2024-03-04 MED ORDER — TRIAMCINOLONE ACETONIDE 10 MG/ML IJ SUSP
10.0000 mg | Freq: Once | INTRAMUSCULAR | Status: AC
  Administered 2024-03-04: 10 mg

## 2024-03-04 NOTE — Progress Notes (Signed)
 Patient presents follow-up plantar fasciitis right.  Doing better probably about 75 to 80% better.  Still sore.   Physical exam:  General appearance: Pleasant, and in no acute distress. AOx3.  Vascular: Pedal pulses: DP 2/4 bilaterally, PT 2/4 bilaterally.  No edema lower legs bilaterally. Capillary fill time immediate bilaterally.  Neurological: Negative Tinel's sign tarsal tunnel and porta pedis right  Dermatologic:   Skin normal temperature bilaterally.  Skin normal color, tone, and texture bilaterally.   Musculoskeletal: Tenderness plantar medial aspect heel right plantar calcaneal tubercle.  No tenderness to lateral compression of calcaneus.  No fibromas noted.    Diagnosis: 1.  Plantar fasciitis right  Plan: -Plan fasciitis improving.  Will do a second injection today.  Should give it about 3 or 4 weeks if it still painful we can always do a third injection -injected 3cc 2:1 mixture 0.5 cc Marcaine:Kenolog 10mg /71ml at medial origin plantar fascia at the plantar calcaneal tubercle right.    Return as needed

## 2024-03-28 ENCOUNTER — Ambulatory Visit: Admitting: Podiatry

## 2024-03-28 ENCOUNTER — Encounter: Payer: Self-pay | Admitting: Podiatry

## 2024-03-28 DIAGNOSIS — M722 Plantar fascial fibromatosis: Secondary | ICD-10-CM | POA: Diagnosis not present

## 2024-03-28 MED ORDER — TRIAMCINOLONE ACETONIDE 10 MG/ML IJ SUSP
10.0000 mg | Freq: Once | INTRAMUSCULAR | Status: AC
  Administered 2024-03-28: 10 mg

## 2024-03-28 NOTE — Progress Notes (Signed)
 Presents today follow-up plantar fasciitis right foot.  Doing about 75 puts her send better but still sore has had 2 injections so far.  No other changes in symptoms other than decrease in severity   Physical exam:  General appearance: Pleasant, and in no acute distress. AOx3.  Vascular: Pedal pulses: DP 2/4 bilaterally, PT 2/4 bilaterally.  No edema lower legs bilaterally. Capillary fill time immediate.  Neurological: Negative Tinel's sign tarsal tunnel and porta pedis right  Dermatologic:   Skin normal temperature bilaterally.  Skin normal color, tone, and texture bilaterally.   Musculoskeletal: Tenderness plantar medial aspect heel at plantar calcaneal tubercle medially right.  No tenderness with lateral compression of the calcaneus.  No tenderness along the arch    Diagnosis: 1. plantar fasciitis right-improving  Plan: -Continue stretching exercises.  Continue wearing good supportive shoes.  Continue icing. -injected 3cc 2:1 mixture 0.5 cc Marcaine:Kenolog 10mg /61ml at medial origin plantar fascia at medial calcaneal tubercle right.    Return 2 weeks follow-up injection plantar fascia right

## 2024-04-10 ENCOUNTER — Ambulatory Visit: Admitting: Podiatry

## 2024-04-10 ENCOUNTER — Encounter: Payer: Self-pay | Admitting: Podiatry

## 2024-04-10 DIAGNOSIS — M722 Plantar fascial fibromatosis: Secondary | ICD-10-CM

## 2024-04-10 NOTE — Progress Notes (Signed)
 Patient presents still complaining of heel pain but says it 75% better not nearly as painful as it was before   Physical exam:  General appearance: Pleasant, and in no acute distress. AOx3.  Vascular: Pedal pulses: DP 2/4 bilaterally, PT 2/4 bilaterally. mild edema lower legs bilaterally. Capillary fill time immediate .  Neurological: Negative Tinel's sign tarsal normal porta pedis right  Dermatologic:   Skin normal temperature bilaterally.  Skin normal color, tone, and texture bilaterally.   Musculoskeletal: Decreased pain at plantar medial calcaneal tubercle right no tenderness lateral compression of calcaneus    Diagnosis: 1.  Plantar fasciitis right foot  Plan: -Established office visit for evaluation and management level 3. - Will try prescribing prescription for 12-day taper of prednisone and see how he does with this.  Continue to be careful about the shoes he wears.  Continue exercises. - Rx prednisone 5 mg, 30 mg p.o. daily first day and then dropping by 5 mg every other day over 12 days   Return 2 weeks follow-up plantar fasciitis right

## 2024-04-16 ENCOUNTER — Other Ambulatory Visit: Payer: Self-pay | Admitting: Podiatry

## 2024-04-16 ENCOUNTER — Telehealth: Payer: Self-pay | Admitting: Podiatry

## 2024-04-16 MED ORDER — PREDNISONE 5 MG PO TABS: ORAL_TABLET | ORAL | 1 refills | Status: DC

## 2024-04-16 NOTE — Telephone Encounter (Signed)
 Patient was seen 04/10/2024 and Rx prednisone  5 mg, 30 mg p.o. daily first day and then dropping by 5 mg every other day over 12 days was supposed to be sent to [facility] on [address]. I do not see that the Rx was sent.

## 2024-04-24 ENCOUNTER — Encounter: Payer: Self-pay | Admitting: Podiatry

## 2024-04-24 ENCOUNTER — Ambulatory Visit: Admitting: Podiatry

## 2024-04-24 DIAGNOSIS — M722 Plantar fascial fibromatosis: Secondary | ICD-10-CM | POA: Diagnosis not present

## 2024-04-24 MED ORDER — TRIAMCINOLONE ACETONIDE 10 MG/ML IJ SUSP
10.0000 mg | Freq: Once | INTRAMUSCULAR | Status: AC
  Administered 2024-04-24 (×2): 10 mg

## 2024-04-24 NOTE — Progress Notes (Signed)
 Patient presents follow-up plantar fasciitis right.  Has some improvement.  Never got the prednisone  prescription.  Says he gets some shooting up the back of the foot also along the Achilles tendon.   Physical exam:  General appearance: Pleasant, and in no acute distress. AOx3.  Vascular: Pedal pulses: DP 2/4 bilaterally, PT 2 to/4 bilaterally.  Mild edema lower legs bilaterally.   Neurological: Light touch intact feet bilaterally.  Normal Achilles reflex bilaterally.  No clonus or spasticity noted.  Negative Tinel's sign tarsal tunnel and porta pedis right.  Dermatologic:   Skin normal temperature bilaterally.  Skin normal color, tone, and texture bilaterally.   Musculoskeletal: Tenderness at plantar medial aspect calcaneus at plantar medial calcaneal tubercle right.  No tenderness with lateral compression of calcaneus.  No tenderness along the Achilles tendon.    Diagnosis: 1 plantar fasciitis  right.  Plan: -Continue wearing  good supportive shoes and doing exercises. -injected 3cc 2:1 mixture 0.5 cc Marcaine:Kenolog 10mg /1ml at plantar fascia origin at medial plantar calcaneal tubercle right.    Return 2 weeks follow-up injection plantar fascia right

## 2024-05-07 ENCOUNTER — Ambulatory Visit: Admitting: Podiatry

## 2024-05-07 ENCOUNTER — Encounter: Payer: Self-pay | Admitting: Podiatry

## 2024-05-07 DIAGNOSIS — M722 Plantar fascial fibromatosis: Secondary | ICD-10-CM | POA: Diagnosis not present

## 2024-05-07 MED ORDER — TRIAMCINOLONE ACETONIDE 10 MG/ML IJ SUSP
10.0000 mg | Freq: Once | INTRAMUSCULAR | Status: AC
  Administered 2024-05-07: 10 mg

## 2024-05-07 NOTE — Progress Notes (Signed)
 Patient presents with a complaint of pain in the plantar medial aspect of the heel right still bothering him.  Achilles tendon has been doing well with very little pain or discomfort.   Physical exam:  General appearance: Pleasant, and in no acute distress. AOx3.  Vascular: Pedal pulses: DP 2/4 bilaterally, PT 2/4 bilaterally.   Neurological: Negative Tinel's sign TT and Porta pedis RT.  Dermatologic:   Skin normal temperature bilaterally.  Skin normal color, tone, and texture bilaterally.   Musculoskeletal: Tenderness to the medial plantar calcaneal tubercle right .right No tenderness lateral compression of the calcaneus.    Diagnosis: 1.  Plantar fasciitis right  Plan: -Continue good supportive shoes.  Ice as needed -injected 3cc 2:1 mixture 0.5 cc Marcaine:Kenolog 10mg /41ml at medial origin plantar fascial medial calcaneal tubercle right.    Return 2 week f/u inj RT

## 2024-05-23 ENCOUNTER — Ambulatory Visit: Admitting: Podiatry

## 2024-05-23 DIAGNOSIS — M722 Plantar fascial fibromatosis: Secondary | ICD-10-CM

## 2024-05-23 MED ORDER — PREDNISONE 5 MG PO TABS: ORAL_TABLET | ORAL | 1 refills | Status: AC

## 2024-05-23 NOTE — Progress Notes (Signed)
 Patient presents still complaining of pain that heel left right.  Has not really gotten any improvement.  He is and being careful shoes that he is wearing and trying to exercise and do icing.  Has had several injections and they have only given modest improvement.  r.   Physical exam:  General appearance: Pleasant, and in no acute distress. AOx3.  Vascular: Pedal pulses: DP 2 to/4 bilaterally, PT 2/4 bilaterally.  Neurological: Negative Tinel's sign tarsal tunnel and porta pedis bilaterally.  Dermatologic:   Skin normal temperature bilaterally.  Skin normal color, tone, and texture bilaterally.   Musculoskeletal: Tenderness to the plantar medial aspect of the heel at the medial calcaneal plantar calcaneal tubercle.   Diagnosis: 1.  Plantar fasciitis right  Plan: -Established office visit for evaluation and management.  Level 3. - Patient continues to have pain.  Will try pneumatic cast along with a 12-day prednisone  taper and see how he responds to this.  If he continues to have pain then physical therapy or surgery options will have to be considered. -Rx prednisone  5 mg, 30 mg p.o. daily first day, then decrease by 5 mg every other day for 12 days.  Return 2 weeks follow-up

## 2024-05-28 ENCOUNTER — Encounter: Payer: Self-pay | Admitting: Podiatry

## 2024-06-13 ENCOUNTER — Encounter: Payer: Self-pay | Admitting: Podiatry

## 2024-06-13 ENCOUNTER — Ambulatory Visit: Admitting: Podiatry

## 2024-06-13 DIAGNOSIS — M722 Plantar fascial fibromatosis: Secondary | ICD-10-CM

## 2024-06-13 MED ORDER — NAPROXEN 500 MG PO TABS: 500.0000 mg | ORAL_TABLET | Freq: Two times a day (BID) | ORAL | 1 refills | Status: DC

## 2024-06-13 NOTE — Progress Notes (Signed)
 Patient presents follow-up plantar fasciitis right.  Doing much better the pain is considerably reduced probably about 70% better wearing the boot.  Using the pneumatic cast well with relief of symptoms.  Finish the prednisone .   Physical exam:  General appearance: Pleasant, and in no acute distress. AOx3.  Vascular: Pedal pulses: DP 2/4 bilaterally, PT 2/4 bilaterally. Minimal edema lower legs bilaterally.   Neurological: Negative Tinel sign tarsal tunnel and porta pedis right  Dermatologic:   Skin normal temperature bilaterally.  Skin normal color, tone, and texture bilaterally.   Musculoskeletal: Tenderness plantar central and plantar medial aspect heel right.  Tenderness at the medial plantar calcaneal tubercle.  No tenderness about compression of calcaneus right.    Diagnosis: 1.  Plantar fasciitis right  Plan: -Established office visit for evaluation and management level 3. - Patient is improving after 12-day prednisone  taper and using the pneumatic cast.  Will have him continue using the [place] cast for another 3 weeks and then reevaluate.  We are hoping this will resolve the plantar fasciitis pain has been having.  If not then we can consider physical therapy or surgery. -Rx naproxen 500 mg 1 p.o. twice daily, 1 refill  Return 3 weeks follow-up plantar fasciitis right

## 2024-06-21 ENCOUNTER — Telehealth: Payer: Self-pay | Admitting: Podiatry

## 2024-06-21 DIAGNOSIS — Z0279 Encounter for issue of other medical certificate: Secondary | ICD-10-CM

## 2024-06-21 NOTE — Telephone Encounter (Signed)
 lft mess on vmail for pt to adv the 25 fee must be paid prior to forms completed that we have recd fron [employer]

## 2024-06-25 ENCOUNTER — Telehealth: Payer: Self-pay | Admitting: Podiatry

## 2024-06-25 NOTE — Telephone Encounter (Signed)
 cld pt to adv forms ready for pick and he wants me to fax to [facility] [phone]

## 2024-06-25 NOTE — Telephone Encounter (Signed)
 Forms for [employer] completed and faxed back to [place] office for the pt to pick up RTW approx 07/06/24.

## 2024-07-05 ENCOUNTER — Ambulatory Visit: Admitting: Podiatry

## 2024-07-05 ENCOUNTER — Encounter: Payer: Self-pay | Admitting: Podiatry

## 2024-07-05 VITALS — Ht 71.0 in | Wt 188.5 lb

## 2024-07-05 DIAGNOSIS — M722 Plantar fascial fibromatosis: Secondary | ICD-10-CM | POA: Diagnosis not present

## 2024-07-05 NOTE — Progress Notes (Signed)
 Patient present follow-up plantar fasciitis right foo t.  Continues to improve wearing the pneumatic cast.  Not as much pain as before.   Physical exam:  General appearance: Pleasant, and in no acute distress. AOx3.  Vascular: Pedal pulses: DP 2/4 bilaterally, PT 2/4 bilaterally.  Mild edema lower legs bilaterally. Capillary fill time needed bilaterally.  Neurological:  Tinel's sign tarsal tunnel and porta pedis bilateral  Dermatologic:   Skin normal temperature bilaterally.  Skin normal color, tone, and texture bilaterally.   Musculoskeletal: Tenderness along the plantar fascia proximal one third more focal area just proximal to the medial ankle are l calcaneal tubercle.  Diagnosis: 1 plantar fasciitis right improved  Plan: -Established office visit for evaluation and management level 3. - Discussed him continuing using the pneumatic cast for another 3 weeks since he continues to improve.  If he does not get improvement after this or is not resolved then may have to consider either physical therapy or surgery. -Continue icing exercises  Return 3 weeks follow-up plantar fasciitis right

## 2024-07-08 ENCOUNTER — Telehealth: Payer: Self-pay | Admitting: Podiatry

## 2024-07-08 DIAGNOSIS — Z0279 Encounter for issue of other medical certificate: Secondary | ICD-10-CM

## 2024-07-08 NOTE — Telephone Encounter (Signed)
 pt is coming to [place] office to pay 25 for [employer] form.

## 2024-07-09 ENCOUNTER — Telehealth: Payer: Self-pay | Admitting: Podiatry

## 2024-07-09 NOTE — Telephone Encounter (Signed)
 Faxed [place] [employer] [phone] form/notes Pt paid 25.00 07/08/24

## 2024-07-26 ENCOUNTER — Encounter: Payer: Self-pay | Admitting: Podiatry

## 2024-07-26 ENCOUNTER — Ambulatory Visit: Admitting: Podiatry

## 2024-07-26 DIAGNOSIS — M722 Plantar fascial fibromatosis: Secondary | ICD-10-CM | POA: Diagnosis not present

## 2024-07-26 NOTE — Progress Notes (Signed)
 Patient presents with slight soreness at the heel on the right.  Doing much better.  Still some little bit of aching and soreness when he is on it.   Physical exam:  General appearance: Pleasant, and in no acute distress. AOx3.  Vascular: Pedal pulses: DP 2/4 bilaterally, PT 2/4 bilaterally. Mild edema lower legs bilaterally. Capillary fill time immediate bilaterally.  Neurological: Negative Tinel sign tarsal tunnel and porta pedis bilateral  Dermatologic:   Skin normal temperature bilaterally.  Skin normal color, tone, and texture bilaterally.   Musculoskeletal: Some soreness on the proximal one third of the plantar fascial band but less painful than before.  No tenderness lateral compression of calcaneus.    Diagnosis: 1.  Plantar fasciitis right  Plan: -Established office visit for evaluation and management level 3 -Continue exercises.  Return to regular shoes.  Increase activity as tolerated.  We given a release for work without restrictions.  Since he seems to be doing better we will just see him as needed.  Told to give it about 4 weeks if it still bothering at that point or it starts flaring up again we will probably try physical therapy.  Return as needed

## 2024-07-29 ENCOUNTER — Other Ambulatory Visit: Payer: Self-pay | Admitting: Lab

## 2024-07-29 MED ORDER — NAPROXEN 500 MG PO TABS: 500.0000 mg | ORAL_TABLET | Freq: Two times a day (BID) | ORAL | 1 refills | Status: DC

## 2024-09-24 ENCOUNTER — Other Ambulatory Visit: Payer: Self-pay | Admitting: Lab

## 2024-09-24 MED ORDER — NAPROXEN 500 MG PO TABS: 500.0000 mg | ORAL_TABLET | Freq: Two times a day (BID) | ORAL | 1 refills | Status: AC

## 2024-09-26 ENCOUNTER — Other Ambulatory Visit (HOSPITAL_BASED_OUTPATIENT_CLINIC_OR_DEPARTMENT_OTHER): Payer: Self-pay | Admitting: Family Medicine

## 2024-09-26 DIAGNOSIS — Z136 Encounter for screening for cardiovascular disorders: Secondary | ICD-10-CM

## 2024-10-25 ENCOUNTER — Other Ambulatory Visit (HOSPITAL_BASED_OUTPATIENT_CLINIC_OR_DEPARTMENT_OTHER)
